# Patient Record
Sex: Female | Born: 1970 | Hispanic: Yes | Marital: Married | Smoking: Never smoker
Health system: Southern US, Community
[De-identification: ages and names within clinical notes are randomized; demographics above are authoritative.]

## PROBLEM LIST (undated history)

## (undated) DIAGNOSIS — F329 Major depressive disorder, single episode, unspecified: Secondary | ICD-10-CM

## (undated) DIAGNOSIS — F32A Depression, unspecified: Secondary | ICD-10-CM

## (undated) DIAGNOSIS — Z789 Other specified health status: Secondary | ICD-10-CM

## (undated) DIAGNOSIS — D649 Anemia, unspecified: Secondary | ICD-10-CM

## (undated) HISTORY — DX: Anemia, unspecified: D64.9

## (undated) HISTORY — DX: Major depressive disorder, single episode, unspecified: F32.9

## (undated) HISTORY — DX: Depression, unspecified: F32.A

---

## 2006-06-04 ENCOUNTER — Encounter (INDEPENDENT_AMBULATORY_CARE_PROVIDER_SITE_OTHER): Payer: Self-pay | Admitting: *Deleted

## 2006-06-04 ENCOUNTER — Inpatient Hospital Stay (HOSPITAL_COMMUNITY): Admission: RE | Admit: 2006-06-04 | Discharge: 2006-06-06 | Payer: Self-pay | Admitting: Obstetrics and Gynecology

## 2007-08-05 ENCOUNTER — Ambulatory Visit (HOSPITAL_COMMUNITY): Admission: RE | Admit: 2007-08-05 | Discharge: 2007-08-05 | Payer: Self-pay | Admitting: Family Medicine

## 2008-04-27 HISTORY — PX: CATARACT EXTRACTION, BILATERAL: SHX1313

## 2009-04-11 ENCOUNTER — Emergency Department (HOSPITAL_COMMUNITY): Admission: EM | Admit: 2009-04-11 | Discharge: 2009-04-11 | Payer: Self-pay | Admitting: Emergency Medicine

## 2010-04-27 NOTE — L&D Delivery Note (Signed)
Delivery Note At  a viable female was delivered via SVD  (Presentation Direct OA ).  APGAR: 9,9 ; weight 8# 13 oz .   Placenta status: intact, with 3 vessel Cord:  with no complications: .   Anesthesia:  None Episiotomy: None Lacerations: None Suture Repair: N/A Est. Blood Loss (mL): 500  Mom to postpartum.  Baby to nursery-stable.  Sarah Foley 03/17/2011, 6:40 PM

## 2010-05-18 ENCOUNTER — Encounter: Payer: Self-pay | Admitting: Family Medicine

## 2010-06-24 ENCOUNTER — Other Ambulatory Visit: Payer: Self-pay | Admitting: Obstetrics and Gynecology

## 2010-06-24 ENCOUNTER — Encounter (HOSPITAL_COMMUNITY): Payer: PRIVATE HEALTH INSURANCE | Attending: Obstetrics and Gynecology

## 2010-06-24 DIAGNOSIS — Z01812 Encounter for preprocedural laboratory examination: Secondary | ICD-10-CM | POA: Insufficient documentation

## 2010-06-24 LAB — CBC
MCH: 27.3 pg (ref 26.0–34.0)
MCV: 83.7 fL (ref 78.0–100.0)
Platelets: 308 10*3/uL (ref 150–400)
RDW: 14.1 % (ref 11.5–15.5)
WBC: 8.2 10*3/uL (ref 4.0–10.5)

## 2010-06-26 HISTORY — PX: TUBAL LIGATION: SHX77

## 2010-07-01 ENCOUNTER — Ambulatory Visit (HOSPITAL_COMMUNITY)
Admission: RE | Admit: 2010-07-01 | Discharge: 2010-07-01 | Disposition: A | Discharge status: Home / Self Care | Payer: PRIVATE HEALTH INSURANCE | Source: Ambulatory Visit | Attending: Obstetrics and Gynecology | Admitting: Obstetrics and Gynecology

## 2010-07-01 DIAGNOSIS — Z302 Encounter for sterilization: Secondary | ICD-10-CM | POA: Insufficient documentation

## 2010-07-04 NOTE — H&P (Signed)
  Sarah Foley, Sarah Foley NO.:  [id number]  MEDICAL RECORD NO.:  [id number]           PATIENT TYPE:  LOCATION:                                 FACILITY:  PHYSICIAN:  Tilda Burrow, M.D. DATE OF BIRTH:  04-Apr-1971  DATE OF ADMISSION: DATE OF DISCHARGE:  LH                             HISTORY & PHYSICAL   ADMISSION DIAGNOSIS:  Desire for elective permanent sterilization.  This is scheduled for at [facility] on July 01, 2010, at 7:30 a.m.  HISTORY OF PRESENT ILLNESS:  This 40 year old multiparous Hispanic female, gravida 4, para 4-0-0-4, referred from [facility] to [facility], is scheduled for county funded tubal sterilization which has been scheduled on June 18, 2010.  The patient has signed tubal sterilization forms for state family planning funds.  Signature was June 08, 2010.  Permanency of the procedure has been reviewed with the patient in Spanish by the Health Department as well as by myself.  The patient understands the permanency and articulates that she desires permanent sterilization.  She was accompanied to her visit by her husband.  PAST MEDICAL HISTORY:  Benign.  PAST SURGICAL HISTORY:  Negative.  No allergies.  No medications.  Current contraceptive method is condoms.  Her last period was February 14th.  The patient understands the importance of reliable contraception use for now.  REVIEW OF SYSTEMS:  Essentially negative.  PHYSICAL EXAMINATION:  GENERAL:  A healthy-appearing, Hispanic female, alert, oriented x3. HEENT:  Pupils equal, round, and reactive.  Extraocular movements intact. NECK:  Supple. CHEST:  Clear to auscultation. ABDOMEN:  Nontender. GENITOURINARY:  External Genitalia:  Multiparous.  Cervix nonpurulent. GC and Chlamydia performed at the Health Department and reported as negative. EXTREMITIES:  Without cyanosis, clubbing, or edema.  PLAN:  Laparoscopic tubal  sterilization, Falope rings, to be performed on July 01, 2010.     Tilda Burrow, M.D.     JVF/MEDQ  D:  06/18/2010  T:  06/19/2010  Job:  [id number]  cc:   [facility]  [facility]  Electronically Signed by Christin Bach M.D. on 07/04/2010 10:41:55 AM

## 2010-07-04 NOTE — Op Note (Signed)
  NAMEEASTYN, SKALLA NO.:  [id number]  MEDICAL RECORD NO.:  [id number]           PATIENT TYPE:  O  LOCATION:  DAYP                          FACILITY:  [facility]  PHYSICIAN:  Tilda Burrow, M.D. DATE OF BIRTH:  [birth year]  DATE OF PROCEDURE:  07/01/2010 DATE OF DISCHARGE:                              OPERATIVE REPORT   PREOPERATIVE DIAGNOSIS:  Desire for elective permanent sterilization.  POSTOPERATIVE DIAGNOSIS:  Desire for elective permanent sterilization.  PROCEDURE:  Laparoscopic tubal sterilization, Falope rings, county funded.  SURGEON:  Tilda Burrow, MD  ASSISTANT:  Amie Critchley.  ANESTHESIA:  General.  COMPLICATIONS:  None.  FINDINGS:  Normal tubes bilaterally, anteflexed uterus, normal size.  INDICATIONS:  This 40 year old female signed tubal sterilization forms at health department and referred to our office for procedure. Counseled at the health department and again in our office and procedure is scheduled on this date.  DETAILS OF PROCEDURE:  The patient was taken to operating room, prepped and draped for a combined abdominal and vaginal procedure.  The bladder was not catheterized.  Time-out was conducted, and the procedure confirmed by involved parties.  A Hulka uterine manipulator was placed on the cervix.  An abdominal procedure initiated with an infraumbilical vertical skin incision with transverse suprapubic incision.  Midline insertion of the umbilical trocar using direct insertion technique was performed with good results.  Suprapubic trocar was placed under direct visualization with laparoscopic camera activated.  Attention was directed to the pelvis where each fallopian tube was identified to its fimbriated end.  The midportion of the tube incarcerated in the Falope ring applier and the ring applied without difficulty.  Marcaine was injected percutaneously beneath the knuckle of tube and in the mesosalpinx beneath the ring to  assist with postop analgesia.  The patient had deflation of abdomen, removal of laparoscopic instruments, subcu closure of the umbilical and suprapubic trocar sites and went to recovery room in good condition with sponge and needle counts correct.     Tilda Burrow, M.D.     JVF/MEDQ  D:  07/01/2010  T:  07/02/2010  Job:  [id number]  cc:   [facility]  Electronically Signed by Christin Bach M.D. on 07/04/2010 10:42:04 AM

## 2010-08-22 LAB — GC/CHLAMYDIA PROBE AMP, GENITAL
Chlamydia: NEGATIVE
Gonorrhea: NEGATIVE

## 2010-08-22 LAB — ABO/RH

## 2010-08-22 LAB — RUBELLA ANTIBODY, IGM: Rubella: IMMUNE

## 2010-09-12 NOTE — Group Therapy Note (Signed)
Sarah Foley, Sarah Foley NO.:  [id number]   MEDICAL RECORD NO.:  [id number]          PATIENT TYPE:  INP   LOCATION:  LDR2                          FACILITY:  [facility]   PHYSICIAN:  Lazaro Arms, M.D.   DATE OF BIRTH:  08/18/1970   DATE OF PROCEDURE:  DATE OF DISCHARGE:                                 PROGRESS NOTE   DELIVERY NOTE:  [name] progressed very nicely through labor and was  noted to be fully dilated with an urge to push at approximately 10:45.  We have not seen any fluid, amniotic fluid up to this point.  She pushed  over three contractions and had a spontaneous vaginal delivery of a  viable female infant at 10:54.  The mouth and nose were suctioned on the  perineum with a bulb syringe and after delivery of the body a large  amount of very thick meconium followed.  The baby had not made any  respiratory effort at this time, so the baby was deep suctioned with a  DeLee suction, yielding about 5 mL of thick meconium-stained fluid.  At  that point, the cord which was rather short was doubly clamped and cut  and the baby was taken to the warmer for resuscitative efforts.  Apgar's  are 4 and 8.  Weight is 7 pounds 3 ounces.  Pitocin 20 units diluted in  1000 mL of lactated Ringer's was infused rapidly IV.  The placenta  separated spontaneously and was delivered via controlled cord traction  at 11 a.m..  It was inspected and appears to be intact with a three-  vessel cord.  It is being sent to pathology due to meconium staining and  low Apgar's.  The vagina was inspected and no lacerations were found.  Estimated blood loss 100 mL.      Jacklyn Shell, C.N.M.      Lazaro Arms, M.D.  Electronically Signed    FC/MEDQ  D:  06/04/2006  T:  06/04/2006  Job:  [id number]   cc:   Donna Bernard, M.D.  Fax: [phone]   [facility]

## 2010-09-12 NOTE — H&P (Signed)
Sarah Foley, Sarah Foley            ACCOUNT NO.:  [id number]   MEDICAL RECORD NO.:  [id number]          PATIENT TYPE:  INP   LOCATION:  LDR2                          FACILITY:  [facility]   PHYSICIAN:  Lazaro Arms, M.D.   DATE OF BIRTH:  08/18/1970   DATE OF ADMISSION:  06/04/2006  DATE OF DISCHARGE:  LH                              HISTORY & PHYSICAL   CHIEF COMPLAINT:  Labor pain starting at 0600.   HISTORY OF PRESENT ILLNESS:  The patient is a gravida 4, para 3 with an  EDC of June 02, 2006, based on last menstrual period and correlating  first and second trimester ultrasound.  She began prenatal care in her  first trimester, and has had regular visits since then.   PRENATAL COURSE:  Her prenatal course has been uneventful.   PRENATAL LABS:  Blood type 0 positive.  Rubella immune.  HBSAG, HIV,  RPR, gonorrhea, chlamydia are all negative.  She is sera positive for  type 2 HSV.  She is group B strep negative.  Blood pressures have been  100s-120s/60s-80s, total weight gain about 15 pounds with appropriate  fundal height growth.   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   PAST SURGICAL HISTORY:  None.   ALLERGIES:  NO DRUG ALLERGIES.   MEDICATIONS:  Valtrex 1 gram p.o. daily.   SOCIAL HISTORY:  Married.  Denies cigarette smoking, alcohol or drug  use.   OBSTETRICAL HISTORY:  She has had 3 babies in [place] vaginally and  uncomplicated.   PHYSICAL EXAMINATION:  HEENT:  Within normal limits.  HEART:  Regular rate and rhythm.  LUNGS:  Clear.  ABDOMEN:  Soft and nontender.  She is having moderate contractions every  3-4 minutes.  PELVIC:  Upon admission at 6 a.m. she was 2 cm, 50% effaced, minus 2  station.  Now 3 hours later she is 4 cm,  100% effaced, minus 1 station.  FETAL MONITORING:  Fetal heart rate is reactive.   PLAN:  She has plans for IV pain medication.      Jacklyn Shell, C.N.M.      Lazaro Arms, M.D.  Electronically Signed    FC/MEDQ  D:  06/04/2006  T:  06/04/2006  Job:  [id number]

## 2010-09-19 ENCOUNTER — Other Ambulatory Visit: Payer: Self-pay | Admitting: Obstetrics and Gynecology

## 2010-09-19 ENCOUNTER — Other Ambulatory Visit (HOSPITAL_COMMUNITY)
Admission: RE | Admit: 2010-09-19 | Discharge: 2010-09-19 | Disposition: A | Discharge status: Home / Self Care | Payer: PRIVATE HEALTH INSURANCE | Source: Ambulatory Visit | Attending: Obstetrics and Gynecology | Admitting: Obstetrics and Gynecology

## 2010-09-19 DIAGNOSIS — Z01419 Encounter for gynecological examination (general) (routine) without abnormal findings: Secondary | ICD-10-CM | POA: Insufficient documentation

## 2010-09-19 DIAGNOSIS — Z113 Encounter for screening for infections with a predominantly sexual mode of transmission: Secondary | ICD-10-CM | POA: Insufficient documentation

## 2011-03-11 ENCOUNTER — Encounter (HOSPITAL_COMMUNITY): Payer: Self-pay | Admitting: *Deleted

## 2011-03-11 ENCOUNTER — Telehealth (HOSPITAL_COMMUNITY): Payer: Self-pay | Admitting: *Deleted

## 2011-03-11 NOTE — Telephone Encounter (Signed)
Preadmission screen  

## 2011-03-17 ENCOUNTER — Inpatient Hospital Stay (HOSPITAL_COMMUNITY)
Admission: RE | Admit: 2011-03-17 | Discharge: 2011-03-19 | DRG: 775 | Disposition: A | Discharge status: Home / Self Care | Payer: Medicaid Other | Source: Ambulatory Visit | Attending: Obstetrics & Gynecology | Admitting: Obstetrics & Gynecology

## 2011-03-17 ENCOUNTER — Encounter (HOSPITAL_COMMUNITY): Payer: Self-pay

## 2011-03-17 DIAGNOSIS — O480 Post-term pregnancy: Secondary | ICD-10-CM

## 2011-03-17 DIAGNOSIS — O09529 Supervision of elderly multigravida, unspecified trimester: Secondary | ICD-10-CM

## 2011-03-17 DIAGNOSIS — Z3480 Encounter for supervision of other normal pregnancy, unspecified trimester: Secondary | ICD-10-CM

## 2011-03-17 HISTORY — DX: Other specified health status: Z78.9

## 2011-03-17 LAB — RPR: RPR Ser Ql: NONREACTIVE

## 2011-03-17 LAB — CBC
HCT: 39.3 % (ref 36.0–46.0)
Hemoglobin: 12.9 g/dL (ref 12.0–15.0)
MCH: 28.4 pg (ref 26.0–34.0)
MCHC: 32.8 g/dL (ref 30.0–36.0)
MCV: 86.4 fL (ref 78.0–100.0)

## 2011-03-17 MED ORDER — MISOPROSTOL 25 MCG QUARTER TABLET
25.0000 ug | ORAL_TABLET | ORAL | Status: DC | PRN
  Administered 2011-03-17: 25 ug via VAGINAL
  Filled 2011-03-17 (×2): qty 0.25

## 2011-03-17 MED ORDER — MEASLES, MUMPS & RUBELLA VAC ~~LOC~~ INJ
0.5000 mL | INJECTION | Freq: Once | SUBCUTANEOUS | Status: DC
  Filled 2011-03-17: qty 0.5

## 2011-03-17 MED ORDER — LACTATED RINGERS IV SOLN: 500.0000 mL | INTRAVENOUS | Status: DC | PRN

## 2011-03-17 MED ORDER — TETANUS-DIPHTH-ACELL PERTUSSIS 5-2.5-18.5 LF-MCG/0.5 IM SUSP
0.5000 mL | Freq: Once | INTRAMUSCULAR | Status: AC
  Administered 2011-03-19: 0.5 mL via INTRAMUSCULAR
  Filled 2011-03-17: qty 0.5

## 2011-03-17 MED ORDER — ACETAMINOPHEN 325 MG PO TABS: 650.0000 mg | ORAL_TABLET | ORAL | Status: DC | PRN

## 2011-03-17 MED ORDER — DIPHENHYDRAMINE HCL 25 MG PO CAPS: 25.0000 mg | ORAL_CAPSULE | Freq: Four times a day (QID) | ORAL | Status: DC | PRN

## 2011-03-17 MED ORDER — SENNOSIDES-DOCUSATE SODIUM 8.6-50 MG PO TABS
2.0000 | ORAL_TABLET | Freq: Every day | ORAL | Status: DC
  Administered 2011-03-17 – 2011-03-18 (×2): 2 via ORAL

## 2011-03-17 MED ORDER — LIDOCAINE HCL (PF) 1 % IJ SOLN
30.0000 mL | INTRAMUSCULAR | Status: DC | PRN
  Filled 2011-03-17: qty 30

## 2011-03-17 MED ORDER — OXYTOCIN 20 UNITS IN LACTATED RINGERS INFUSION - SIMPLE
1.0000 m[IU]/min | INTRAVENOUS | Status: DC
  Administered 2011-03-17: 2 m[IU]/min via INTRAVENOUS
  Filled 2011-03-17: qty 1000

## 2011-03-17 MED ORDER — OXYCODONE-ACETAMINOPHEN 5-325 MG PO TABS
1.0000 | ORAL_TABLET | ORAL | Status: DC | PRN
  Administered 2011-03-17: 1 via ORAL

## 2011-03-17 MED ORDER — FLEET ENEMA 7-19 GM/118ML RE ENEM: 1.0000 | ENEMA | RECTAL | Status: DC | PRN

## 2011-03-17 MED ORDER — DIBUCAINE 1 % RE OINT: 1.0000 "application " | TOPICAL_OINTMENT | RECTAL | Status: DC | PRN

## 2011-03-17 MED ORDER — LACTATED RINGERS IV SOLN
INTRAVENOUS | Status: DC
  Administered 2011-03-17: 08:00:00 via INTRAVENOUS

## 2011-03-17 MED ORDER — IBUPROFEN 600 MG PO TABS
600.0000 mg | ORAL_TABLET | Freq: Four times a day (QID) | ORAL | Status: DC
  Administered 2011-03-18 – 2011-03-19 (×5): 600 mg via ORAL
  Filled 2011-03-17 (×6): qty 1

## 2011-03-17 MED ORDER — ONDANSETRON HCL 4 MG PO TABS: 4.0000 mg | ORAL_TABLET | ORAL | Status: DC | PRN

## 2011-03-17 MED ORDER — PRENATAL PLUS 27-1 MG PO TABS
1.0000 | ORAL_TABLET | Freq: Every day | ORAL | Status: DC
  Administered 2011-03-18 – 2011-03-19 (×2): 1 via ORAL
  Filled 2011-03-17 (×2): qty 1

## 2011-03-17 MED ORDER — TERBUTALINE SULFATE 1 MG/ML IJ SOLN: 0.2500 mg | Freq: Once | INTRAMUSCULAR | Status: DC | PRN

## 2011-03-17 MED ORDER — SIMETHICONE 80 MG PO CHEW: 80.0000 mg | CHEWABLE_TABLET | ORAL | Status: DC | PRN

## 2011-03-17 MED ORDER — ZOLPIDEM TARTRATE 5 MG PO TABS: 5.0000 mg | ORAL_TABLET | Freq: Every evening | ORAL | Status: DC | PRN

## 2011-03-17 MED ORDER — CITRIC ACID-SODIUM CITRATE 334-500 MG/5ML PO SOLN: 30.0000 mL | ORAL | Status: DC | PRN

## 2011-03-17 MED ORDER — OXYCODONE-ACETAMINOPHEN 5-325 MG PO TABS
2.0000 | ORAL_TABLET | ORAL | Status: DC | PRN
  Filled 2011-03-17: qty 1

## 2011-03-17 MED ORDER — OXYTOCIN 20 UNITS IN LACTATED RINGERS INFUSION - SIMPLE
125.0000 mL/h | Freq: Once | INTRAVENOUS | Status: AC
  Administered 2011-03-17: 999 mL/h via INTRAVENOUS

## 2011-03-17 MED ORDER — ONDANSETRON HCL 4 MG/2ML IJ SOLN: 4.0000 mg | Freq: Four times a day (QID) | INTRAMUSCULAR | Status: DC | PRN

## 2011-03-17 MED ORDER — IBUPROFEN 600 MG PO TABS
600.0000 mg | ORAL_TABLET | Freq: Four times a day (QID) | ORAL | Status: DC | PRN
  Administered 2011-03-17: 600 mg via ORAL
  Filled 2011-03-17: qty 1

## 2011-03-17 MED ORDER — LANOLIN HYDROUS EX OINT: TOPICAL_OINTMENT | CUTANEOUS | Status: DC | PRN

## 2011-03-17 MED ORDER — WITCH HAZEL-GLYCERIN EX PADS: 1.0000 "application " | MEDICATED_PAD | CUTANEOUS | Status: DC | PRN

## 2011-03-17 MED ORDER — ONDANSETRON HCL 4 MG/2ML IJ SOLN: 4.0000 mg | INTRAMUSCULAR | Status: DC | PRN

## 2011-03-17 MED ORDER — BENZOCAINE-MENTHOL 20-0.5 % EX AERO: 1.0000 "application " | INHALATION_SPRAY | CUTANEOUS | Status: DC | PRN

## 2011-03-17 MED ORDER — OXYTOCIN BOLUS FROM INFUSION
500.0000 mL | Freq: Once | INTRAVENOUS | Status: DC
  Filled 2011-03-17: qty 500

## 2011-03-17 NOTE — H&P (Signed)
Sarah Foley is a 40 y.o. female presenting for induction of labor for postdates. Maternal Medical History:  Prenatal Complications - Diabetes: none.    This is a 40 year old G5P4004 at 41 weeks and 0 days who is being admitted for labor induction for postdates.  Denies fevers, chills, nausea, vomiting, LOF, decreased FA, vaginal bleeding, vaginal discharge, LOF, abdominal pain, back pain, changes in vision, HA.   OB History    Grav Para Term Preterm Abortions TAB SAB Ect Mult Living   5 4 4       4      Past Medical History  Diagnosis Date  . Cataract     Leye  . No pertinent past medical history    Past Surgical History  Procedure Date  . Tubal ligation 06/2010    neg HCG prior to tubal +hcg 1 week after  . Cataract extraction, bilateral    Family History: family history includes Mental retardation in her sister.  There is no history of Anesthesia problems, and Hypotension, and Malignant hyperthermia, and Pseudochol deficiency, . Social History:  reports that she has never smoked. She has never used smokeless tobacco. She reports that she does not drink alcohol or use illicit drugs.  Review of Systems  Eyes: Negative for blurred vision.  All other systems reviewed and are negative.    Dilation: Closed Effacement (%): 50 Station: Ballotable Exam by:: Stinson, DO Blood pressure 114/56, pulse 71, temperature 97.7 F (36.5 C), temperature source Oral, resp. rate 20, height 5\' 1"  (1.549 m), weight 101.606 kg (224 lb). Maternal Exam:  Abdomen: Fundal height is term.   Fetal presentation: vertex  Introitus: Normal vulva. Normal vagina.    Physical Exam  Constitutional: She is oriented to person, place, and time. She appears well-developed and well-nourished.  HENT:  Head: Normocephalic and atraumatic.  GI: Soft. Bowel sounds are normal. She exhibits no distension and no mass. There is no tenderness. There is no rebound and no guarding.  Neurological: She is  alert and oriented to person, place, and time.  Skin: Skin is warm and dry. No rash noted. No erythema. No pallor.    Dilation: Closed Effacement (%): 50 Cervical Position: Posterior Station: Ballotable Presentation: Vertex Exam by:: Stinson, DO  Prenatal labs: ABO, Rh: O/Positive/-- (04/27 0000) Antibody: Negative (04/27 0000) Rubella: Immune (04/27 0000) RPR: Nonreactive (04/27 0000)  HBsAg: Negative (04/27 0000)  HIV: Non-reactive (04/27 0000)  GBS: Negative (10/24 0000)  2hr GTT: 78, 136, 100  Assessment/Plan: #29 40 year old G5P4004 at 41weeks and 0 days. #2 Advanced Maternal Age #3 GBS neg #4 Postdates  Admit to L&D Cytotec 25 mcg vaginally q 4hours Able to eat until start pitocin.    STINSON, JACOB JEHIEL 03/17/2011, 9:56 AM

## 2011-03-17 NOTE — Progress Notes (Signed)
Subjective: Very little contractions.  Objective: BP 113/63  Pulse 73  Temp(Src) 97.7 F (36.5 C) (Oral)  Resp 20  Ht 5\' 1"  (1.549 m)  Wt 101.606 kg (224 lb)  BMI 42.32 kg/m2     FHT:  FHR: 150s bpm, variability: moderate,  accelerations:  Present,  decelerations:  Absent UC:   none SVE:   Dilation: 3 Effacement (%): 70 Station: -2 Exam by:: Dr Adrian Blackwater  Labs: Lab Results  Component Value Date   WBC 9.3 03/17/2011   HGB 12.9 03/17/2011   HCT 39.3 03/17/2011   MCV 86.4 03/17/2011   PLT 220 03/17/2011    Assessment / Plan: IOL.  Will start pitocin. Fetal Wellbeing:  Category I Pain Control:  Labor support without medications I/D:  n/a Anticipated MOD:  NSVD  Atilla Zollner JEHIEL 03/17/2011, 2:13 PM

## 2011-03-18 MED ORDER — INFLUENZA VIRUS VACC SPLIT PF IM SUSP
0.5000 mL | INTRAMUSCULAR | Status: AC
  Administered 2011-03-19: 0.5 mL via INTRAMUSCULAR
  Filled 2011-03-18: qty 0.5

## 2011-03-18 MED ORDER — MENTHOL 3 MG MT LOZG
1.0000 | LOZENGE | OROMUCOSAL | Status: DC | PRN
  Administered 2011-03-18: 3 mg via ORAL
  Filled 2011-03-18: qty 9

## 2011-03-18 MED ORDER — BENZOCAINE-MENTHOL 20-0.5 % EX AERO
INHALATION_SPRAY | CUTANEOUS | Status: AC
  Administered 2011-03-18: 15:00:00
  Filled 2011-03-18: qty 56

## 2011-03-18 NOTE — Progress Notes (Signed)
UR chart review completed.  

## 2011-03-18 NOTE — Progress Notes (Signed)
Post Partum Day 1 Subjective: no complaints, up ad lib, voiding and tolerating PO  Objective: Blood pressure 107/67, pulse 69, temperature 98.3 F (36.8 C), temperature source Oral, resp. rate 18, height 5\' 1"  (1.549 m), weight 101.606 kg (224 lb), unknown if currently breastfeeding.  Physical Exam:  General: alert, cooperative and fatigued Lochia: appropriate Uterine Fundus: firm   Basename 03/17/11 0915  HGB 12.9  HCT 39.3    Assessment/Plan: Plan for discharge tomorrow and Breastfeeding   LOS: 1 day   Josedejesus Marcum 03/18/2011, 7:31 AM

## 2011-03-19 DIAGNOSIS — Z3480 Encounter for supervision of other normal pregnancy, unspecified trimester: Secondary | ICD-10-CM

## 2011-03-19 MED ORDER — IBUPROFEN 600 MG PO TABS: 600.0000 mg | ORAL_TABLET | Freq: Four times a day (QID) | ORAL | Status: AC

## 2011-03-19 NOTE — Progress Notes (Signed)
Subjective  Rt upper leg has felt heavy and numb, but not painful, since delivery. No difficulty walking. Breastfeeding without difficulty. Lochia tapering. Afterpains mild.  Ambulatory without orthostatic sx.  Had BTL in early pregnancy.  Bonding well. Has help at home. Desires discharge.   Objective  Filed Vitals:   03/18/11 2150  BP: 95/55  Pulse: 80  Temp: 98.4 F (36.9 C)  Resp: 18    Breasts: soft, filling  ZOX:WRUE, nontender, fundus firm, well below umbilicus  Ext: No redness, tenderness,warmth or swelling of rt upper extremity; nontender calves; trace dependent edema    Assessment   G1P1001 PPD#1 s/p uncomplicated NSVD, doing well  Plan  Discharge home.  Meds: PN vitamins Discharge instructions: per booklet, reviewed F/U at [facility] in 6 weeks   Kolden Dupee 03/19/2011 6:37 AM

## 2011-03-19 NOTE — Discharge Summary (Signed)
Obstetric Discharge Summary Reason for Admission: onset of labor Prenatal Procedures: ultrasound Had BTL early in pregnancy Intrapartum Procedures: spontaneous vaginal delivery Postpartum Procedures: none Complications-Operative and Postpartum: none Hemoglobin  Date Value Range Status  03/17/2011 12.9  12.0-15.0 (g/dL) Final     HCT  Date Value Range Status  03/17/2011 39.3  36.0-46.0 (%) Final    Discharge Diagnoses: Term Pregnancy-delivered  Discharge Information: Date: 03/19/2011 Activity: pelvic rest Diet: routine Medications: Ibuprofen Condition: stable Instructions: refer to practice specific booklet Discharge to: home Follow-up Information    Follow up with [facility]. Make an appointment in 6 weeks.         Newborn Data: Live born female  Birth Weight: 8 lb 13.3 oz (4006 g) APGAR: 9, 9  Home with mother.  Bronc Brosseau 03/19/2011, 7:01 AM

## 2011-03-23 NOTE — Discharge Summary (Signed)
Agree with above discharge document.

## 2011-04-27 ENCOUNTER — Inpatient Hospital Stay (HOSPITAL_COMMUNITY): Admission: AD | Admit: 2011-04-27 | Payer: Self-pay | Source: Ambulatory Visit | Admitting: Obstetrics and Gynecology

## 2011-10-21 ENCOUNTER — Other Ambulatory Visit (HOSPITAL_COMMUNITY): Payer: Self-pay | Admitting: Nurse Practitioner

## 2011-10-21 ENCOUNTER — Other Ambulatory Visit (HOSPITAL_COMMUNITY): Payer: Self-pay | Admitting: *Deleted

## 2011-10-28 ENCOUNTER — Ambulatory Visit (HOSPITAL_COMMUNITY)
Admission: RE | Admit: 2011-10-28 | Discharge: 2011-10-28 | Disposition: A | Discharge status: Home / Self Care | Payer: PRIVATE HEALTH INSURANCE | Source: Ambulatory Visit | Attending: *Deleted | Admitting: *Deleted

## 2011-10-28 ENCOUNTER — Other Ambulatory Visit (HOSPITAL_COMMUNITY): Payer: Self-pay | Admitting: *Deleted

## 2011-10-28 ENCOUNTER — Encounter (HOSPITAL_COMMUNITY): Payer: Medicaid Other

## 2011-10-28 DIAGNOSIS — N63 Unspecified lump in unspecified breast: Secondary | ICD-10-CM | POA: Insufficient documentation

## 2012-10-31 ENCOUNTER — Other Ambulatory Visit (HOSPITAL_COMMUNITY): Payer: Self-pay | Admitting: Physician Assistant

## 2012-10-31 DIAGNOSIS — Z139 Encounter for screening, unspecified: Secondary | ICD-10-CM

## 2012-11-03 ENCOUNTER — Ambulatory Visit (HOSPITAL_COMMUNITY)
Admission: RE | Admit: 2012-11-03 | Discharge: 2012-11-03 | Disposition: A | Discharge status: Home / Self Care | Payer: Self-pay | Source: Ambulatory Visit | Attending: Physician Assistant | Admitting: Physician Assistant

## 2012-11-03 DIAGNOSIS — Z139 Encounter for screening, unspecified: Secondary | ICD-10-CM

## 2013-11-29 ENCOUNTER — Other Ambulatory Visit (HOSPITAL_COMMUNITY): Payer: Self-pay | Admitting: Physician Assistant

## 2013-11-29 DIAGNOSIS — Z1231 Encounter for screening mammogram for malignant neoplasm of breast: Secondary | ICD-10-CM

## 2013-12-11 ENCOUNTER — Ambulatory Visit (HOSPITAL_COMMUNITY)
Admission: RE | Admit: 2013-12-11 | Discharge: 2013-12-11 | Disposition: A | Discharge status: Home / Self Care | Payer: Medicaid Other | Source: Ambulatory Visit | Attending: Physician Assistant | Admitting: Physician Assistant

## 2013-12-11 DIAGNOSIS — Z1231 Encounter for screening mammogram for malignant neoplasm of breast: Secondary | ICD-10-CM

## 2013-12-14 ENCOUNTER — Ambulatory Visit (HOSPITAL_COMMUNITY): Payer: Medicaid Other

## 2014-02-26 ENCOUNTER — Encounter (HOSPITAL_COMMUNITY): Payer: Self-pay

## 2014-04-17 ENCOUNTER — Encounter: Payer: Self-pay | Admitting: Women's Health

## 2014-04-17 ENCOUNTER — Other Ambulatory Visit: Payer: Self-pay | Admitting: Women's Health

## 2014-05-31 ENCOUNTER — Other Ambulatory Visit (HOSPITAL_COMMUNITY): Payer: Self-pay | Admitting: Physician Assistant

## 2014-05-31 DIAGNOSIS — N946 Dysmenorrhea, unspecified: Secondary | ICD-10-CM

## 2014-06-07 ENCOUNTER — Ambulatory Visit (HOSPITAL_COMMUNITY)
Admission: RE | Admit: 2014-06-07 | Discharge: 2014-06-07 | Disposition: A | Discharge status: Home / Self Care | Payer: Self-pay | Source: Ambulatory Visit | Attending: Physician Assistant | Admitting: Physician Assistant

## 2014-06-07 ENCOUNTER — Ambulatory Visit (HOSPITAL_COMMUNITY): Payer: Self-pay

## 2014-06-07 DIAGNOSIS — N946 Dysmenorrhea, unspecified: Secondary | ICD-10-CM | POA: Insufficient documentation

## 2014-06-28 ENCOUNTER — Encounter: Payer: Self-pay | Admitting: Obstetrics and Gynecology

## 2014-06-28 ENCOUNTER — Ambulatory Visit (INDEPENDENT_AMBULATORY_CARE_PROVIDER_SITE_OTHER): Payer: Self-pay | Admitting: Obstetrics and Gynecology

## 2014-06-28 VITALS — BP 110/60 | HR 70 | Wt 213.0 lb

## 2014-06-28 DIAGNOSIS — D509 Iron deficiency anemia, unspecified: Secondary | ICD-10-CM

## 2014-06-28 DIAGNOSIS — N920 Excessive and frequent menstruation with regular cycle: Secondary | ICD-10-CM

## 2014-06-28 NOTE — Progress Notes (Signed)
Patient ID: Sarah Foley, female   DOB: 14-Jul-1970, 44 y.o.   MRN: [id number] Pt here today for heavy periods.    [facility] Visit  Patient name: Sarah Foley MRN [id number]  Date of birth: 13-Aug-1970  CC & HPI:  Sarah Foley is a 44 y.o. female presenting today for consult re control of menses  ROS:  Has had u/s. Seen at [facility] , see scanned records, reviewed  Pertinent History Reviewed:   Reviewed: Significant for btl  In past.  Medical         Past Medical History  Diagnosis Date  . Cataract     Leye  . No pertinent past medical history                               Surgical Hx:    Past Surgical History  Procedure Laterality Date  . Cataract extraction, bilateral  2010  . Tubal ligation  06/2010    neg HCG prior to tubal +hcg 1 week after   Medications: Reviewed & Updated - see associated section                       Current outpatient prescriptions:  .  prenatal vitamin w/FE, FA (PRENATAL 1 + 1) 27-1 MG TABS, Take 1 tablet by mouth daily.  , Disp: , Rfl:    Social History: Reviewed -  reports that she has never smoked. She has never used smokeless tobacco.   Objective Findings:  Vitals: Blood pressure 110/60, pulse 70, weight 213 lb (96.616 kg), last menstrual period 06/28/2014.  Physical Examination: General appearance - alert, well appearing, and in no distress, oriented to person, place, and time and overweight Mental status - alert, oriented to person, place, and time, normal mood, behavior, speech, dress, motor activity, and thought processes, affect appropriate to mood U/s : [facility] see records.  Assessment & Plan:   A:  1. Menorrhagia with normal anatomy 2. Mild anemia  P: pt is not eligible for county IUD placement as BTL has been done 1. Pt is given info to apply for IUD MIRENA and Liletta, if she can obtain IUD herself, we can insert. 2 pt to apply x 2 and let us know of results. F/u 1 month or prn Time: over  45 minutes spent in phone calls, coordination of care, investigation and counselling of pt , and records review 3. Consider depo provera or OCP if not eligible for IUD>

## 2014-07-02 ENCOUNTER — Encounter: Payer: Self-pay | Admitting: Obstetrics and Gynecology

## 2014-08-10 ENCOUNTER — Ambulatory Visit (INDEPENDENT_AMBULATORY_CARE_PROVIDER_SITE_OTHER): Payer: Self-pay | Admitting: Obstetrics and Gynecology

## 2014-08-10 ENCOUNTER — Encounter: Payer: Self-pay | Admitting: Obstetrics and Gynecology

## 2014-08-10 VITALS — BP 100/60 | Ht 61.0 in | Wt 211.0 lb

## 2014-08-10 DIAGNOSIS — N921 Excessive and frequent menstruation with irregular cycle: Secondary | ICD-10-CM

## 2014-08-10 NOTE — Progress Notes (Signed)
Patient ID: Sarah Foley, female   DOB: Sep 06, 1970, 44 y.o.   MRN: [id number]  This chart was SCRIBED for Mallory Shirk, MD by Stephania Fragmin, ED Scribe. This patient was seen in my office and the patient's care was started at 10:19 AM.    [facility] Visit  Patient name: Sarah Foley MRN [id number]  Date of birth: 10-Sep-1970  CC & HPI:  Sarah Foley is a 44 y.o. female presenting today for follow-up of menorrhagia. Patient  Is asking for assistance for applying for Mirena. Patient has brought blank forms [facility] Patient Assistance Program Application Forms) with her to fill out.   ROS:  n/a  Pertinent History Reviewed:   Reviewed: Significant for BTL Medical         Past Medical History  Diagnosis Date  . Cataract     Leye  . No pertinent past medical history   . Depression   . Anemia                               Surgical Hx:    Past Surgical History  Procedure Laterality Date  . Cataract extraction, bilateral  2010  . Tubal ligation  06/2010    neg HCG prior to tubal +hcg 1 week after   Medications: Reviewed & Updated - see associated section                       Current outpatient prescriptions:  .  ferrous sulfate 325 (65 FE) MG tablet, Take 325 mg by mouth daily with breakfast., Disp: , Rfl:    Social History: Reviewed -  reports that she has never smoked. She has never used smokeless tobacco.  Objective Findings:  Vitals: Blood pressure 100/60, height 5\' 1"  (1.549 m), weight 211 lb (95.709 kg), last menstrual period 07/20/2014.  Physical Examination: Patient here for discussion only.  Spent about 56minutes in office filling out patient's forms and answering any questions patient has.  Assessment & Plan:   Follow up in 1 month. Or upon receipt of Mirena approval  I personally performed the services described in this documentation, which was SCRIBED in my presence. The recorded information has been reviewed and considered  accurate. It has been edited as necessary during review. [name], MD

## 2014-08-10 NOTE — Progress Notes (Signed)
Patient ID: Sarah Foley, female   DOB: 1970-09-17, 44 y.o.   MRN: [id number] Pt here today for follow up. Pt to discuss the next step and what to do about the bleeding.

## 2014-09-07 ENCOUNTER — Encounter: Payer: Self-pay | Admitting: Obstetrics and Gynecology

## 2014-09-07 ENCOUNTER — Ambulatory Visit (INDEPENDENT_AMBULATORY_CARE_PROVIDER_SITE_OTHER): Payer: Self-pay | Admitting: Obstetrics and Gynecology

## 2014-09-07 ENCOUNTER — Ambulatory Visit: Payer: Self-pay | Admitting: Obstetrics and Gynecology

## 2014-09-07 VITALS — BP 110/60 | Wt 210.0 lb

## 2014-09-07 DIAGNOSIS — D509 Iron deficiency anemia, unspecified: Secondary | ICD-10-CM

## 2014-09-07 DIAGNOSIS — N920 Excessive and frequent menstruation with regular cycle: Secondary | ICD-10-CM

## 2014-09-07 NOTE — Progress Notes (Signed)
Patient ID: Sarah Foley, female   DOB: 04-02-1971, 44 y.o.   MRN: [id number] Pt here today for follow up and possible IUD insertion.

## 2014-09-07 NOTE — Progress Notes (Signed)
Patient ID: Sarah Foley, female   DOB: February 29, 1971, 44 y.o.   MRN: [id number] Patient ID: Sarah Foley, female   DOB: [birth year], 44 y.o.   MRN: [id number]   [facility] Visit  Patient name: Sarah Foley MRN [id number]  Date of birth: 02-Jan-1971  CC & HPI:  Sarah Foley is a 44 y.o. female presenting today for follow-up treatment of menorrhagia with possible IUD insertion. [facility] Patient Assistance Program Application Forms were filled out during the last visit. She estimates that her LMP began 08/26/14.  ROS:  A complete review of systems was obtained and all systems are negative except as noted in the HPI and PMH.    Pertinent History Reviewed:   Reviewed: Significant for tubal ligation Medical         Past Medical History  Diagnosis Date  . Cataract     Leye  . No pertinent past medical history   . Depression   . Anemia                               Surgical Hx:    Past Surgical History  Procedure Laterality Date  . Cataract extraction, bilateral  2010  . Tubal ligation  06/2010    neg HCG prior to tubal +hcg 1 week after   Medications: Reviewed & Updated - see associated section                       Current outpatient prescriptions:  .  ferrous sulfate 325 (65 FE) MG tablet, Take 325 mg by mouth daily with breakfast., Disp: , Rfl:    Social History: Reviewed -  reports that she has never smoked. She has never used smokeless tobacco.  Objective Findings:  Vitals: Blood pressure 110/60, weight 210 lb (95.255 kg), last menstrual period 07/20/2014.  Physical Examination: Patient here for discussion only.    Assessment & Plan:   A:  1. Pt desires IUD insertion. She states her LMP was ~08/26/14, so we will schedule insertion for the second or third day of her following menses.  P:  1. Schedule IUD insertion for June 2-3.  This chart was SCRIBED for Mallory Shirk, MD by Stephania Fragmin, ED Scribe. This patient was seen in  room 2 and the patient's care was started at 1:52 PM.  I personally performed the services described in this documentation, which was SCRIBED in my presence. The recorded information has been reviewed and considered accurate. It has been edited as necessary during review. [name], MD

## 2014-09-07 NOTE — Progress Notes (Deleted)
Patient ID: Sarah Foley, female   DOB: Jul 23, 1970, 44 y.o.   MRN: [id number]   [facility]  Patient name: Sarah Foley MRN [id number]  Date of birth: [birth year]  CC & HPI:  Sarah Foley is a 44 y.o. female presenting today for follow-up treatment of menorrhagia with possible IUD insertion. [facility] Patient Assistance Program Application Forms were filled out during the last visit. She estimates that her LMP began 08/26/14.  ROS:  A complete review of systems was obtained and all systems are negative except as noted in the HPI and PMH.    Pertinent History Reviewed:   Reviewed: Significant for tubal ligation Medical         Past Medical History  Diagnosis Date   Cataract     Leye   No pertinent past medical history    Depression    Anemia                               Surgical Hx:    Past Surgical History  Procedure Laterality Date   Cataract extraction, bilateral  2010   Tubal ligation  06/2010    neg HCG prior to tubal +hcg 1 week after   Medications: Reviewed & Updated - see associated section                       Current outpatient prescriptions:    ferrous sulfate 325 (65 FE) MG tablet, Take 325 mg by mouth daily with breakfast., Disp: , Rfl:    Social History: Reviewed -  reports that she has never smoked. She has never used smokeless tobacco.  Objective Findings:  Vitals: Blood pressure 110/60, weight 210 lb (95.255 kg), last menstrual period 07/20/2014.  Physical Examination: Patient here for discussion only.    Assessment & Plan:   A:  1. Pt desires IUD insertion. She states her LMP was ~08/26/14, so we will schedule insertion for the second or third day of her following menses.  P:  1. Schedule IUD insertion for June 2-3.  This chart was SCRIBED for Mallory Shirk, MD by Stephania Fragmin, ED Scribe. This patient was seen in room 2 and the patient's care was started at 1:52 PM.  I personally performed the  services described in this documentation, which was SCRIBED in my presence. The recorded information has been reviewed and considered accurate. It has been edited as necessary during review. [name], MD      I

## 2014-09-27 ENCOUNTER — Ambulatory Visit (INDEPENDENT_AMBULATORY_CARE_PROVIDER_SITE_OTHER): Payer: Self-pay | Admitting: Obstetrics and Gynecology

## 2014-09-27 ENCOUNTER — Encounter: Payer: Self-pay | Admitting: Obstetrics and Gynecology

## 2014-09-27 VITALS — BP 110/58 | HR 68 | Wt 207.4 lb

## 2014-09-27 DIAGNOSIS — Z30430 Encounter for insertion of intrauterine contraceptive device: Secondary | ICD-10-CM

## 2014-09-27 DIAGNOSIS — Z3202 Encounter for pregnancy test, result negative: Secondary | ICD-10-CM

## 2014-09-27 DIAGNOSIS — N920 Excessive and frequent menstruation with regular cycle: Secondary | ICD-10-CM

## 2014-09-27 LAB — POCT URINE PREGNANCY: PREG TEST UR: NEGATIVE

## 2014-09-27 NOTE — Progress Notes (Signed)
Patient ID: Sarah Foley, female   DOB: 05/05/1970, 44 y.o.   MRN: [id number]  GYNECOLOGY CLINIC PROCEDURE NOTE  Sarah Foley is a 44 y.o. [id number] here for Mirena IUD insertion.  No GYN concerns.  Last pap smear was on 09/19/10 and was normal.  IUD Insertion Procedure Note Patient identified, informed consent performed, consent signed.   Discussed risks of irregular bleeding, cramping, infection, malpositioning or misplacement of the IUD outside the uterus which may require further procedure such as laparoscopy. Time out was performed.  Urine pregnancy test negative.  Speculum placed in the vagina.  Cervix visualized.  Cleaned with Betadine x 2.  Grasped anteriorly with a single tooth tenaculum.  Uterus sounded to 9 cm. Mirena IUD placed per manufacturer's recommendations.  Strings trimmed to 3 cm. Tenaculum was removed, good hemostasis noted.  Patient tolerated procedure well, with minimal pain, per pt.  Transvaginal Ultrasound was used to confirm IUD position  Patient was given post-procedure instructions.  She was advised to have backup contraception for one week.  Patient was also asked to check IUD strings periodically.  Follow up in 4 weeks for IUD check.   This chart was SCRIBED for Mallory Shirk, MD by Stephania Fragmin, ED Scribe. This patient was seen in room 2 and the patient's care was started at 12:00 PM.  I personally performed the services described in this documentation, which was SCRIBED in my presence. The recorded information has been reviewed and considered accurate. It has been edited as necessary during review. [name], MD

## 2014-10-25 ENCOUNTER — Encounter: Payer: Self-pay | Admitting: Obstetrics and Gynecology

## 2014-10-25 ENCOUNTER — Ambulatory Visit (INDEPENDENT_AMBULATORY_CARE_PROVIDER_SITE_OTHER): Payer: Self-pay | Admitting: Obstetrics and Gynecology

## 2014-10-25 VITALS — BP 106/58 | HR 68 | Wt 205.6 lb

## 2014-10-25 DIAGNOSIS — Z30431 Encounter for routine checking of intrauterine contraceptive device: Secondary | ICD-10-CM | POA: Insufficient documentation

## 2014-10-25 DIAGNOSIS — R102 Pelvic and perineal pain: Secondary | ICD-10-CM

## 2014-10-25 NOTE — Progress Notes (Signed)
   [facility] Visit  Patient name: Sarah Foley MRN [id number]  Date of birth: 08-30-1970  CC & HPI:  Sarah Foley is a 44 y.o. female presenting today for IUD check. She states she has been menstruating with light bleeding for 10 days. She reports pelvic pain only today.   ROS:  A complete review of systems was obtained and all systems are negative except as noted in the HPI and PMH.    Pertinent History Reviewed:   Reviewed: Significant for tubal ligation Medical         Past Medical History  Diagnosis Date  . Cataract     Leye  . No pertinent past medical history   . Depression   . Anemia                               Surgical Hx:    Past Surgical History  Procedure Laterality Date  . Cataract extraction, bilateral  2010  . Tubal ligation  06/2010    neg HCG prior to tubal +hcg 1 week after   Medications: Reviewed & Updated - see associated section                       Current outpatient prescriptions:  .  ferrous sulfate 325 (65 FE) MG tablet, Take 325 mg by mouth daily with breakfast., Disp: , Rfl:    Social History: Reviewed -  reports that she has never smoked. She has never used smokeless tobacco.  Objective Findings:  Vitals: Blood pressure 106/58, pulse 68, weight 205 lb 9.6 oz (93.26 kg).  Physical Examination: General appearance - alert, well appearing, and in no distress, oriented to person, place, and time and overweight Mental status - alert, oriented to person, place, and time, normal mood, behavior, speech, dress, motor activity, and thought processes, affect appropriate to mood Pelvic - normal external genitalia VULVA: normal appearing vulva with no masses, tenderness or lesions,  VAGINA: heavy menstrual blood CERVIX: normal appearing cervix with string length 2 cm beyond cervix ext os.  On U/S,done by JVF as part of assessment. IUD stops 1 cm from the,top of the uterus. It appears the prongs are not widely  deployed. Adnexa are normal, and nontender u/s contact with uterus.  CDS fluid normal  Assessment & Plan:   A:  1. IUD migration 1 cm from apex of endometrial cavity.   P:  1. Patient keep a period calendar in 6 weeks and return. Hopefully, her menses will lighten in that time with IUD presence.  2. See in 6 wk   This chart was SCRIBED for Sarah Shirk, MD by Sarah Foley, ED Scribe. This patient was seen in room 2, and the patient's care was started at 9:47 AM.  I personally performed the services described in this documentation, which was SCRIBED in my presence. The recorded information has been reviewed and considered accurate. It has been edited as necessary during review. Sarah Kind, MD

## 2014-11-15 ENCOUNTER — Ambulatory Visit (INDEPENDENT_AMBULATORY_CARE_PROVIDER_SITE_OTHER): Payer: Self-pay | Admitting: Obstetrics and Gynecology

## 2014-11-15 ENCOUNTER — Encounter: Payer: Self-pay | Admitting: Obstetrics and Gynecology

## 2014-11-15 VITALS — BP 100/58 | Wt 203.5 lb

## 2014-11-15 DIAGNOSIS — Z30431 Encounter for routine checking of intrauterine contraceptive device: Secondary | ICD-10-CM

## 2014-11-15 DIAGNOSIS — D509 Iron deficiency anemia, unspecified: Secondary | ICD-10-CM

## 2014-11-15 DIAGNOSIS — N920 Excessive and frequent menstruation with regular cycle: Secondary | ICD-10-CM

## 2014-11-15 MED ORDER — NORETHINDRONE 0.35 MG PO TABS: 1.0000 | ORAL_TABLET | Freq: Every day | ORAL | Status: DC

## 2014-11-15 NOTE — Progress Notes (Signed)
Patient ID: Sarah Foley, female   DOB: [birth year], 44 y.o.   MRN: [id number]   [facility] Visit  Patient name: Sarah Foley MRN [id number]  Date of birth: Jan 27, 1971  CC & HPI:  Sarah Foley is a 44 y.o. female presenting today for an IUD check and atraumatic, acute, intermittent left sided pain with intercourse and urination.  Pt cannot feel iud string ROS:  A complete 10 system review of systems was obtained and all systems are negative except as noted in the HPI and PMH.    Pertinent History Reviewed:   Reviewed: Significant for tubal ligation.  Medical         Past Medical History  Diagnosis Date  . Cataract     Leye  . No pertinent past medical history   . Depression   . Anemia                               Surgical Hx:    Past Surgical History  Procedure Laterality Date  . Cataract extraction, bilateral  2010  . Tubal ligation  06/2010    neg HCG prior to tubal +hcg 1 week after   Medications: Reviewed & Updated - see associated section                       Current outpatient prescriptions:  .  ferrous sulfate 325 (65 FE) MG tablet, Take 325 mg by mouth daily with breakfast., Disp: , Rfl:    Social History: Reviewed -  reports that she has never smoked. She has never used smokeless tobacco.  Objective Findings:  Vitals: Blood pressure 100/58, weight 203 lb 8 oz (92.307 kg).  Physical Examination: General appearance - alert, well appearing, and in no distress Mental status - alert, oriented to person, place, and time Pelvic - normal external genitalia, vulva, vagina, cervix, uterus and adnexa,  VULVA: normal appearing vulva with no masses, tenderness or lesions,  VAGINA: normal appearing vagina with normal color and discharge, no lesions, IUD string not visible, Ultrasound to ensure IUD in proper location.   CERVIX: nontender,  UTERUS: Uterus is moderately enlarged. 8 weeks size, anteflexed ADNEXA: normal adnexa in  size, nontender and no masses Transvaginal ultrasound shows: anteflex and anteverted uterus; uterine length is 11.8 cm, slightly enlarged  Assessment & Plan:   A:  1. Expelled IUD 2 heavy menses 3. anemial due to blood loss P:  1. Rx mini pill Micronor daily     This chart was scribed for Sarah Kind, MD by Stephania Fragmin, ED Scribe. This patient was seen in room Room 2 and the patient's care was started at 11:37 AM. I personally performed the services described in this documentation, which was SCRIBED in my presence. The recorded information has been reviewed and considered accurate. It has been edited as necessary during review. Sarah Kind, MD   .

## 2014-11-15 NOTE — Progress Notes (Signed)
Patient ID: Sarah Foley, female   DOB: March 06, 1971, 44 y.o.   MRN: [id number] Pt here today for IUD check and pain on her left side.

## 2014-12-06 ENCOUNTER — Ambulatory Visit: Payer: Self-pay | Admitting: Obstetrics and Gynecology

## 2015-02-06 ENCOUNTER — Other Ambulatory Visit: Payer: Self-pay | Admitting: Obstetrics and Gynecology

## 2015-02-18 ENCOUNTER — Ambulatory Visit: Payer: Self-pay | Admitting: Obstetrics and Gynecology

## 2016-03-30 DIAGNOSIS — Z139 Encounter for screening, unspecified: Secondary | ICD-10-CM

## 2016-03-30 LAB — GLUCOSE, POCT (MANUAL RESULT ENTRY): POC Glucose: 122 mg/dl — AB (ref 70–99)

## 2016-03-30 NOTE — Congregational Nurse Program (Signed)
Congregational Nurse Program Note  Date of Encounter: 03/30/2016  Past Medical History: Past Medical History:  Diagnosis Date  . Anemia   . Cataract    Leye  . Depression   . No pertinent past medical history     Encounter Details:     CNP Questionnaire - 03/30/16 1000      Patient Demographics   Is this a new or existing patient? New   Patient is considered a/an Immigrant   Race Latino/Hispanic     Patient Assistance   Location of Patient [place]   Patient's financial/insurance status Low Income;Self-Pay (Uninsured)   Uninsured Patient ([place]) Yes   Interventions Counseled to make appt. with provider;Assisted patient in making appt.   Patient referred to apply for the following financial assistance Not Applicable   Food insecurities addressed Not Applicable   Transportation assistance No   Assistance securing medications No   Educational health offerings Behavioral health;Chronic disease;Navigating the healthcare system     Encounter Details   Primary purpose of visit Chronic Illness/Condition Visit;Education/Health Concerns;Navigating the Healthcare System   Was an Emergency Department visit averted? Not Applicable   Does patient have a medical provider? No   Patient referred to Establish PCP;Clinic;Other   Was a mental health screening completed? (GAINS tool) No   Does patient have dental issues? No   Does patient have vision issues? Yes  blurry vision with reading, she uses 150 strength readers   Was a vision referral made? No   Does your patient have an abnormal blood pressure today? No   Since previous encounter, have you referred patient for abnormal blood pressure that resulted in a new diagnosis or medication change? No   Does your patient have an abnormal blood glucose today? No   Since previous encounter, have you referred patient for abnormal blood glucose that resulted in a new diagnosis or medication change? No   Was there a life-saving intervention made? No     New client to [facility], Had previously been screened and seen by [place] and had been referred to the [facility] at that time, over 1 year ago. Client states she has not seen a provider in over a year.  PMH: Iron defficency anemia, depression . Current medications. Ferrous sulfate(OTC) Client states she is taking daily an old prescription of anitdepressant that was prescribed by the provider at the [facility] , but she doesn't know the name. She states she has been taking it again daily for about 3 weeks . Past surgical history: bilateral cataract surgery 9 years ago. Tubal ligation 5 years ago per client. No Known Allergies Chief concern today is needing a Primary Care provider. Client complains of crying frequently , but denies thoughts of suicide. She states she has good support. She states that is the reason she began taking the depression medication again for 3 weeks now(unknown name) and she states it is helping. Client reports to having increased stress. RN discussed with client that she has options for counseling and that if she decides that she would like to seek those options to call RN with interpreter or discuss with her Primary care provider. RN also gave client contact information in Spanish for [employer]. Last Mammogram 2016, Last PAP 2016 and LMP reported as 02/2016.  Client requests to be referred back into the [facility]. Referral made and first appointment secured for 04/02/16 at 10:45 am.  Will follow up with client after her  first primary care appointment for any further needs.  Client currently lives with her husband and children and her husband is working. She is not working and cares for the home and children.  Interview conducted with the assistance of Demetrius Revel medical interpreter and also Polo Riley LPN .

## 2016-04-02 ENCOUNTER — Encounter: Payer: Self-pay | Admitting: Physician Assistant

## 2016-04-02 ENCOUNTER — Ambulatory Visit: Payer: Self-pay | Admitting: Physician Assistant

## 2016-04-02 VITALS — BP 98/60 | HR 74 | Temp 97.9°F | Ht 63.25 in | Wt 203.0 lb

## 2016-04-02 DIAGNOSIS — E669 Obesity, unspecified: Secondary | ICD-10-CM

## 2016-04-02 DIAGNOSIS — D509 Iron deficiency anemia, unspecified: Secondary | ICD-10-CM

## 2016-04-02 DIAGNOSIS — F329 Major depressive disorder, single episode, unspecified: Secondary | ICD-10-CM

## 2016-04-02 DIAGNOSIS — Z6835 Body mass index (BMI) 35.0-35.9, adult: Secondary | ICD-10-CM

## 2016-04-02 DIAGNOSIS — Z131 Encounter for screening for diabetes mellitus: Secondary | ICD-10-CM

## 2016-04-02 DIAGNOSIS — Z13220 Encounter for screening for lipoid disorders: Secondary | ICD-10-CM

## 2016-04-02 DIAGNOSIS — E66812 Obesity, class 2: Secondary | ICD-10-CM | POA: Insufficient documentation

## 2016-04-02 DIAGNOSIS — F32A Depression, unspecified: Secondary | ICD-10-CM

## 2016-04-02 MED ORDER — CITALOPRAM HYDROBROMIDE 20 MG PO TABS: ORAL_TABLET | ORAL | 1 refills | Status: DC

## 2016-04-02 NOTE — Progress Notes (Signed)
BP 98/60 (BP Location: Left Arm, Patient Position: Sitting, Cuff Size: Large)   Pulse 74   Temp 97.9 F (36.6 C)   Ht 5' 3.25" (1.607 m)   Wt 203 lb (92.1 kg)   LMP 02/29/2016 (Approximate)   SpO2 99%   BMI 35.68 kg/m    Subjective:    Patient ID: Sarah Foley, female    DOB: December 03, 1970, 45 y.o.   MRN: [id number]  HPI: Sarah Foley is a 45 y.o. female presenting on 04/02/2016 for New Patient (Initial Visit) and Depression   HPI  Pt was given rx for ocp after IUD expelled last year but she says she didn't know that.  She no-showed for her f/u with gyn  Pt is taking some old leftover citalopram that she had from when she was on it 1 1/2 year ago.    She says she had a problem that came up that is still going on.  Pt states she feels much better since getting back on the citalopram.  No SI or HI.   Relevant past medical, surgical, family and social history reviewed and updated as indicated. Interim medical history since our last visit reviewed. Allergies and medications reviewed and updated.   Current Outpatient Prescriptions:  .  citalopram (CELEXA) 20 MG tablet, Take 20 mg by mouth daily., Disp: , Rfl:  .  ferrous sulfate 325 (65 FE) MG tablet, Take 325 mg by mouth daily with breakfast., Disp: , Rfl:   Review of Systems  Constitutional: Negative for appetite change, chills, diaphoresis, fatigue, fever and unexpected weight change.  HENT: Negative for congestion, dental problem, drooling, ear pain, facial swelling, hearing loss, mouth sores, sneezing, sore throat, trouble swallowing and voice change.   Eyes: Negative for pain, discharge, redness, itching and visual disturbance.  Respiratory: Negative for cough, choking, shortness of breath and wheezing.   Cardiovascular: Negative for chest pain, palpitations and leg swelling.  Gastrointestinal: Negative for abdominal pain, blood in stool, constipation, diarrhea and vomiting.  Endocrine: Negative for cold  intolerance, heat intolerance and polydipsia.  Genitourinary: Negative for decreased urine volume, dysuria and hematuria.  Musculoskeletal: Negative for arthralgias, back pain and gait problem.  Skin: Negative for rash.  Allergic/Immunologic: Negative for environmental allergies.  Neurological: Negative for seizures, syncope, light-headedness and headaches.  Hematological: Negative for adenopathy.  Psychiatric/Behavioral: Negative for agitation, dysphoric mood and suicidal ideas. The patient is not nervous/anxious.     Per HPI unless specifically indicated above     Objective:    BP 98/60 (BP Location: Left Arm, Patient Position: Sitting, Cuff Size: Large)   Pulse 74   Temp 97.9 F (36.6 C)   Ht 5' 3.25" (1.607 m)   Wt 203 lb (92.1 kg)   LMP 02/29/2016 (Approximate)   SpO2 99%   BMI 35.68 kg/m   Wt Readings from Last 3 Encounters:  04/02/16 203 lb (92.1 kg)  03/30/16 202 lb 3.2 oz (91.7 kg)  11/15/14 203 lb 8 oz (92.3 kg)    Physical Exam  Constitutional: She is oriented to person, place, and time. She appears well-developed and well-nourished.  HENT:  Head: Normocephalic and atraumatic.  Neck: Neck supple.  Cardiovascular: Normal rate and regular rhythm.   Pulmonary/Chest: Effort normal and breath sounds normal.  Abdominal: Soft. Bowel sounds are normal. She exhibits no mass. There is no hepatosplenomegaly. There is no tenderness.  Musculoskeletal: She exhibits no edema.  Lymphadenopathy:    She has no cervical adenopathy.  Neurological: She is alert and  oriented to person, place, and time.  Skin: Skin is warm and dry.  Psychiatric: She has a normal mood and affect. Her behavior is normal.  Vitals reviewed.   Results for orders placed or performed in visit on 03/30/16  POCT glucose  Result Value Ref Range   POC Glucose 122 (A) 70 - 99 mg/dl      Assessment & Plan:    Encounter Diagnoses  Name Primary?  . Depression, unspecified depression type Yes  . Iron  deficiency anemia, unspecified iron deficiency anemia type   . Class 2 obesity with body mass index (BMI) of 35.0 to 35.9 in adult, unspecified obesity type, unspecified whether serious comorbidity present   . Screening cholesterol level   . Screening for diabetes mellitus     -rx citalopram given -get fasting labs -follow up one month.  RTO sooner prn

## 2016-04-06 ENCOUNTER — Telehealth: Payer: Self-pay

## 2016-04-06 NOTE — Telephone Encounter (Signed)
Call was made to patient on 04/06/16 as a follow-up call.  Pt stated she is doing good and the her appointment at the [facility] went well.  Pt mentioned she will be going tomorrow 12/12 to get some labs drawn.  Pt was glad for the follow up call.  Jayshun Galentine R. [place], LPN [id number]

## 2016-04-08 ENCOUNTER — Other Ambulatory Visit: Payer: Self-pay | Admitting: Physician Assistant

## 2016-04-08 DIAGNOSIS — D509 Iron deficiency anemia, unspecified: Secondary | ICD-10-CM

## 2016-04-08 LAB — COMPREHENSIVE METABOLIC PANEL
ALK PHOS: 48 U/L (ref 33–115)
ALT: 16 U/L (ref 6–29)
AST: 17 U/L (ref 10–30)
Albumin: 3.7 g/dL (ref 3.6–5.1)
BILIRUBIN TOTAL: 0.2 mg/dL (ref 0.2–1.2)
BUN: 12 mg/dL (ref 7–25)
CO2: 28 mmol/L (ref 20–31)
Calcium: 8.5 mg/dL — ABNORMAL LOW (ref 8.6–10.2)
Chloride: 106 mmol/L (ref 98–110)
Creat: 0.65 mg/dL (ref 0.50–1.10)
Glucose, Bld: 90 mg/dL (ref 65–99)
Potassium: 4.2 mmol/L (ref 3.5–5.3)
Sodium: 138 mmol/L (ref 135–146)
Total Protein: 6.4 g/dL (ref 6.1–8.1)

## 2016-04-08 LAB — CBC
HEMATOCRIT: 26.7 % — AB (ref 35.0–45.0)
HEMOGLOBIN: 7.4 g/dL — AB (ref 11.7–15.5)
MCH: 18.3 pg — ABNORMAL LOW (ref 27.0–33.0)
MCHC: 27.7 g/dL — ABNORMAL LOW (ref 32.0–36.0)
MCV: 65.9 fL — ABNORMAL LOW (ref 80.0–100.0)
MPV: 8.3 fL (ref 7.5–12.5)
Platelets: 403 10*3/uL — ABNORMAL HIGH (ref 140–400)
RBC: 4.05 MIL/uL (ref 3.80–5.10)
RDW: 17 % — ABNORMAL HIGH (ref 11.0–15.0)
WBC: 6.3 10*3/uL (ref 3.8–10.8)

## 2016-04-08 LAB — LIPID PANEL
Cholesterol: 161 mg/dL (ref <200)
HDL: 53 mg/dL (ref >50)
LDL Cholesterol: 88 mg/dL (ref <100)
Total CHOL/HDL Ratio: 3 Ratio (ref <5.0)
Triglycerides: 99 mg/dL (ref <150)
VLDL: 20 mg/dL (ref <30)

## 2016-04-08 LAB — HEMOGLOBIN A1C
Hgb A1c MFr Bld: 5.3 % (ref <5.7)
MEAN PLASMA GLUCOSE: 105 mg/dL

## 2016-04-28 ENCOUNTER — Other Ambulatory Visit: Payer: Self-pay | Admitting: Student

## 2016-04-28 DIAGNOSIS — D509 Iron deficiency anemia, unspecified: Secondary | ICD-10-CM

## 2016-04-29 LAB — HEMOGLOBIN: Hemoglobin: 8.1 g/dL — ABNORMAL LOW (ref 11.7–15.5)

## 2016-04-29 LAB — HEMATOCRIT: HCT: 29 % — ABNORMAL LOW (ref 35.0–45.0)

## 2016-05-04 ENCOUNTER — Ambulatory Visit: Payer: Self-pay | Admitting: Physician Assistant

## 2016-05-04 ENCOUNTER — Encounter: Payer: Self-pay | Admitting: Physician Assistant

## 2016-05-04 VITALS — BP 118/62 | HR 74 | Temp 97.9°F | Ht 63.25 in | Wt 206.5 lb

## 2016-05-04 DIAGNOSIS — F329 Major depressive disorder, single episode, unspecified: Secondary | ICD-10-CM

## 2016-05-04 DIAGNOSIS — F32A Depression, unspecified: Secondary | ICD-10-CM

## 2016-05-04 DIAGNOSIS — D649 Anemia, unspecified: Secondary | ICD-10-CM

## 2016-05-04 DIAGNOSIS — E669 Obesity, unspecified: Secondary | ICD-10-CM

## 2016-05-04 DIAGNOSIS — N920 Excessive and frequent menstruation with regular cycle: Secondary | ICD-10-CM

## 2016-05-04 DIAGNOSIS — Z6836 Body mass index (BMI) 36.0-36.9, adult: Secondary | ICD-10-CM

## 2016-05-04 NOTE — Progress Notes (Signed)
BP 118/62 (BP Location: Left Arm, Patient Position: Sitting, Cuff Size: Large)   Pulse 74   Temp 97.9 F (36.6 C)   Ht 5' 3.25" (1.607 m)   Wt 206 lb 8 oz (93.7 kg)   SpO2 98%   BMI 36.29 kg/m    Subjective:    Patient ID: Sarah Foley, female    DOB: January 13, 1971, 46 y.o.   MRN: [id number]  HPI: Sarah Foley is a 46 y.o. female presenting on 05/04/2016 for Mental Health Problem and Anemia   HPI   Pt states mood is doing well.  No episodes of tearfulness or feelings of depression. Her "situation" at home that she doesn't want to talk about is improving.   Relevant past medical, surgical, family and social history reviewed and updated as indicated. Interim medical history since our last visit reviewed. Allergies and medications reviewed and updated.   Current Outpatient Prescriptions:  .  citalopram (CELEXA) 20 MG tablet, 1 po qd.  Tome una tableta por boca diaria, Disp: 30 tablet, Rfl: 1 .  ferrous sulfate 325 (65 FE) MG tablet, Take 325 mg by mouth 2 (two) times daily. , Disp: , Rfl:  .  vitamin C (ASCORBIC ACID) 500 MG tablet, Take 500 mg by mouth daily., Disp: , Rfl:    Review of Systems  Constitutional: Positive for fatigue. Negative for appetite change, chills, diaphoresis, fever and unexpected weight change.  HENT: Positive for dental problem. Negative for congestion, drooling, ear pain, facial swelling, hearing loss, mouth sores, sneezing, sore throat, trouble swallowing and voice change.   Eyes: Negative for pain, discharge, redness, itching and visual disturbance.  Respiratory: Negative for cough, choking, shortness of breath and wheezing.   Cardiovascular: Negative for chest pain, palpitations and leg swelling.  Gastrointestinal: Negative for abdominal pain, blood in stool, constipation, diarrhea and vomiting.  Endocrine: Negative for cold intolerance, heat intolerance and polydipsia.  Genitourinary: Negative for decreased urine volume, dysuria  and hematuria.  Musculoskeletal: Negative for arthralgias, back pain and gait problem.  Skin: Negative for rash.  Allergic/Immunologic: Negative for environmental allergies.  Neurological: Negative for seizures, syncope, light-headedness and headaches.  Hematological: Negative for adenopathy.  Psychiatric/Behavioral: Negative for agitation, dysphoric mood and suicidal ideas. The patient is not nervous/anxious.     Per HPI unless specifically indicated above     Objective:    BP 118/62 (BP Location: Left Arm, Patient Position: Sitting, Cuff Size: Large)   Pulse 74   Temp 97.9 F (36.6 C)   Ht 5' 3.25" (1.607 m)   Wt 206 lb 8 oz (93.7 kg)   SpO2 98%   BMI 36.29 kg/m   Wt Readings from Last 3 Encounters:  05/04/16 206 lb 8 oz (93.7 kg)  04/02/16 203 lb (92.1 kg)  03/30/16 202 lb 3.2 oz (91.7 kg)    Physical Exam  Constitutional: She is oriented to person, place, and time. She appears well-developed and well-nourished.  HENT:  Head: Normocephalic and atraumatic.  Neck: Neck supple.  Cardiovascular: Normal rate and regular rhythm.   Pulmonary/Chest: Effort normal and breath sounds normal.  Abdominal: Soft. Bowel sounds are normal. She exhibits no mass. There is no hepatosplenomegaly. There is no tenderness.  Musculoskeletal: She exhibits no edema.  Lymphadenopathy:    She has no cervical adenopathy.  Neurological: She is alert and oriented to person, place, and time.  Skin: Skin is warm and dry.  Psychiatric: She has a normal mood and affect. Her behavior is normal.  Vitals reviewed.  Results for orders placed or performed in visit on 04/28/16  Hematocrit  Result Value Ref Range   HCT 29.0 (L) 35.0 - 45.0 %  Hemoglobin  Result Value Ref Range   Hemoglobin 8.1 (L) 11.7 - 15.5 g/dL      Assessment & Plan:    Encounter Diagnoses  Name Primary?  . Depression, unspecified depression type Yes  . Anemia, unspecified type   . Menorrhagia with regular cycle   . Class  2 obesity with body mass index (BMI) of 36.0 to 36.9 in adult, unspecified obesity type, unspecified whether serious comorbidity present     -Reviewed labs with pt.  H/h still low but much improved from December.  -pt to Continue citalopram for mood -Discussed returning to gyn for heavy menses.  She says yes. Will order referral  -pt turned in her [facility] discount application about 1 1/2 week ago -F/u 6 wk to recheck h/h and make sure she has gyn appt

## 2016-06-09 ENCOUNTER — Other Ambulatory Visit: Payer: Self-pay | Admitting: Student

## 2016-06-09 DIAGNOSIS — N920 Excessive and frequent menstruation with regular cycle: Secondary | ICD-10-CM

## 2016-06-09 DIAGNOSIS — D649 Anemia, unspecified: Secondary | ICD-10-CM

## 2016-06-12 LAB — HEMATOCRIT: HCT: 31 % — ABNORMAL LOW (ref 35.0–45.0)

## 2016-06-12 LAB — HEMOGLOBIN: Hemoglobin: 9 g/dL — ABNORMAL LOW (ref 11.7–15.5)

## 2016-06-16 ENCOUNTER — Ambulatory Visit: Payer: Self-pay | Admitting: Physician Assistant

## 2016-06-16 ENCOUNTER — Encounter: Payer: Self-pay | Admitting: Physician Assistant

## 2016-06-16 VITALS — BP 118/72 | HR 63 | Temp 97.9°F | Ht 63.25 in | Wt 201.5 lb

## 2016-06-16 DIAGNOSIS — D649 Anemia, unspecified: Secondary | ICD-10-CM

## 2016-06-16 DIAGNOSIS — N920 Excessive and frequent menstruation with regular cycle: Secondary | ICD-10-CM

## 2016-06-16 DIAGNOSIS — Z6835 Body mass index (BMI) 35.0-35.9, adult: Secondary | ICD-10-CM

## 2016-06-16 DIAGNOSIS — E66812 Obesity, class 2: Secondary | ICD-10-CM

## 2016-06-16 DIAGNOSIS — E669 Obesity, unspecified: Secondary | ICD-10-CM

## 2016-06-16 DIAGNOSIS — F329 Major depressive disorder, single episode, unspecified: Secondary | ICD-10-CM

## 2016-06-16 DIAGNOSIS — F32A Depression, unspecified: Secondary | ICD-10-CM

## 2016-06-16 NOTE — Progress Notes (Signed)
BP 118/72 (BP Location: Left Arm, Patient Position: Sitting, Cuff Size: Large)   Pulse 63   Temp 97.9 F (36.6 C) (Other (Comment))   Ht 5' 3.25" (1.607 m)   Wt 201 lb 8 oz (91.4 kg)   SpO2 99%   BMI 35.41 kg/m    Subjective:    Patient ID: Sarah Foley, female    DOB: 26-Jun-1970, 46 y.o.   MRN: [id number]  HPI: Jeanene Morici is a 46 y.o. female presenting on 06/16/2016 for Anemia and Mental Health Problem   HPI  Pt still doesn't have appointment with gyn.  Nurse called Caryl Pina about the discount application and pt still has papers that she needs to turn in for the application to be complete.  Nurse relayed information to pt and told pt to contact [facility] when she has done this so we can see that she gets scheduled.  Pt is feeling well today  Relevant past medical, surgical, family and social history reviewed and updated as indicated. Interim medical history since our last visit reviewed. Allergies and medications reviewed and updated.   Current Outpatient Prescriptions:  .  citalopram (CELEXA) 20 MG tablet, 1 po qd.  Tome una tableta por boca diaria, Disp: 30 tablet, Rfl: 1 .  ferrous sulfate 325 (65 FE) MG tablet, Take 325 mg by mouth 2 (two) times daily. , Disp: , Rfl:  .  vitamin C (ASCORBIC ACID) 500 MG tablet, Take 500 mg by mouth daily., Disp: , Rfl:    Review of Systems  Constitutional: Negative for appetite change, chills, diaphoresis, fatigue, fever and unexpected weight change.  HENT: Positive for dental problem. Negative for congestion, drooling, ear pain, facial swelling, hearing loss, mouth sores, sneezing, sore throat, trouble swallowing and voice change.   Eyes: Negative for pain, discharge, redness, itching and visual disturbance.  Respiratory: Negative for cough, choking, shortness of breath and wheezing.   Cardiovascular: Negative for chest pain, palpitations and leg swelling.  Gastrointestinal: Negative for abdominal pain, blood in  stool, constipation, diarrhea and vomiting.  Endocrine: Negative for cold intolerance, heat intolerance and polydipsia.  Genitourinary: Negative for decreased urine volume, dysuria and hematuria.  Musculoskeletal: Negative for arthralgias, back pain and gait problem.  Skin: Negative for rash.  Allergic/Immunologic: Negative for environmental allergies.  Neurological: Negative for seizures, syncope, light-headedness and headaches.  Hematological: Negative for adenopathy.  Psychiatric/Behavioral: Negative for agitation, dysphoric mood and suicidal ideas. The patient is not nervous/anxious.     Per HPI unless specifically indicated above     Objective:    BP 118/72 (BP Location: Left Arm, Patient Position: Sitting, Cuff Size: Large)   Pulse 63   Temp 97.9 F (36.6 C) (Other (Comment))   Ht 5' 3.25" (1.607 m)   Wt 201 lb 8 oz (91.4 kg)   SpO2 99%   BMI 35.41 kg/m   Wt Readings from Last 3 Encounters:  06/16/16 201 lb 8 oz (91.4 kg)  05/04/16 206 lb 8 oz (93.7 kg)  04/02/16 203 lb (92.1 kg)    Physical Exam  Constitutional: She is oriented to person, place, and time. She appears well-developed and well-nourished.  HENT:  Head: Normocephalic and atraumatic.  Neck: Neck supple.  Cardiovascular: Normal rate and regular rhythm.   Pulmonary/Chest: Effort normal and breath sounds normal.  Abdominal: Soft. Bowel sounds are normal. She exhibits no mass. There is no hepatosplenomegaly. There is no tenderness.  Musculoskeletal: She exhibits no edema.  Lymphadenopathy:    She has no cervical adenopathy.  Neurological: She is alert and oriented to person, place, and time.  Skin: Skin is warm and dry.  Psychiatric: She has a normal mood and affect. Her behavior is normal.  Vitals reviewed.   Results for orders placed or performed in visit on 06/09/16  Hematocrit  Result Value Ref Range   HCT 31.0 (L) 35.0 - 45.0 %  Hemoglobin  Result Value Ref Range   Hemoglobin 9.0 (L) 11.7 - 15.5  g/dL      Assessment & Plan:    Encounter Diagnoses  Name Primary?  . Depression, unspecified depression type Yes  . Anemia, unspecified type   . Menorrhagia with regular cycle   . Class 2 obesity with body mass index (BMI) of 35.0 to 35.9 in adult, unspecified obesity type, unspecified whether serious comorbidity present     -Reviewed labs with pt -pt to Continue iron -F/u 1 month to make sure pt has appointment with gyn

## 2016-06-29 ENCOUNTER — Other Ambulatory Visit: Payer: Self-pay | Admitting: Physician Assistant

## 2016-06-29 ENCOUNTER — Ambulatory Visit: Payer: Self-pay | Admitting: Physician Assistant

## 2016-06-29 ENCOUNTER — Encounter: Payer: Self-pay | Admitting: Physician Assistant

## 2016-06-29 VITALS — BP 126/78 | HR 63 | Temp 97.9°F | Ht 63.25 in | Wt 199.0 lb

## 2016-06-29 DIAGNOSIS — R21 Rash and other nonspecific skin eruption: Secondary | ICD-10-CM

## 2016-06-29 MED ORDER — PREDNISONE 10 MG PO TABS: ORAL_TABLET | ORAL | 0 refills | Status: AC

## 2016-06-29 NOTE — Progress Notes (Signed)
BP 126/78 (BP Location: Left Arm, Patient Position: Sitting, Cuff Size: Normal)   Pulse 63   Temp 97.9 F (36.6 C)   Ht 5' 3.25" (1.607 m)   Wt 199 lb (90.3 kg)   SpO2 98%   BMI 34.97 kg/m    Subjective:    Patient ID: Sarah Foley, female    DOB: 1971/03/29, 46 y.o.   MRN: [id number]  HPI: Sarah Foley is a 46 y.o. female presenting on 06/29/2016 for Rash (Rash began last Thursday. pt started taking Nutrilite last Monday. pt unsure if she is consuming to many vitamins and it is causing her harm. pt states she also got [place] product called bedoyecta about 20 days ago. pt states she has applied alcohol to rash and is not very helpful. pt states very itchy. pt states rash on her arms, abdomen, and legs.)   HPI   Chief Complaint  Patient presents with  . Rash    Rash began last Thursday. pt started taking Nutrilite last Monday. pt unsure if she is consuming to many vitamins and it is causing her harm. pt states she also got [place] product called bedoyecta about 20 days ago. pt states she has applied alcohol to rash and is not very helpful. pt states very itchy. pt states rash on her arms, abdomen, and legs.   OTCs currently using:  Nutrilite Daily vitamin and mineral packets, Nutrilite Iron and Folic Acid, ZEAL nutritional drink mix, vitamin C.  Relevant past medical, surgical, family and social history reviewed and updated as indicated. Interim medical history since our last visit reviewed. Allergies and medications reviewed and updated.   Current Outpatient Prescriptions:  .  OVER THE COUNTER MEDICATION, , Disp: , Rfl:  .  OVER THE COUNTER MEDICATION, , Disp: , Rfl:  .  OVER THE COUNTER MEDICATION, , Disp: , Rfl:  .  vitamin C (ASCORBIC ACID) 500 MG tablet, Take 500 mg by mouth daily., Disp: , Rfl:  .  citalopram (CELEXA) 20 MG tablet, 1 po qd.  Tome una tableta por boca diaria (Patient not taking: Reported on 06/29/2016), Disp: 30 tablet, Rfl:  1   Review of Systems  Constitutional: Negative for appetite change, chills, diaphoresis, fatigue, fever and unexpected weight change.  HENT: Negative for congestion, drooling, ear pain, facial swelling, hearing loss, mouth sores, sneezing, sore throat, trouble swallowing and voice change.   Eyes: Negative for pain, discharge, redness, itching and visual disturbance.  Respiratory: Negative for cough, choking, shortness of breath and wheezing.   Cardiovascular: Negative for chest pain, palpitations and leg swelling.  Gastrointestinal: Negative for abdominal pain, blood in stool, constipation, diarrhea and vomiting.  Endocrine: Negative for cold intolerance, heat intolerance and polydipsia.  Genitourinary: Negative for decreased urine volume, dysuria and hematuria.  Musculoskeletal: Negative for arthralgias, back pain and gait problem.  Skin: Positive for rash.  Allergic/Immunologic: Negative for environmental allergies.  Neurological: Negative for seizures, syncope, light-headedness and headaches.  Hematological: Negative for adenopathy.  Psychiatric/Behavioral: Negative for agitation, dysphoric mood and suicidal ideas. The patient is not nervous/anxious.     Per HPI unless specifically indicated above     Objective:    BP 126/78 (BP Location: Left Arm, Patient Position: Sitting, Cuff Size: Normal)   Pulse 63   Temp 97.9 F (36.6 C)   Ht 5' 3.25" (1.607 m)   Wt 199 lb (90.3 kg)   SpO2 98%   BMI 34.97 kg/m   Wt Readings from Last 3 Encounters:  06/29/16 199  lb (90.3 kg)  06/16/16 201 lb 8 oz (91.4 kg)  05/04/16 206 lb 8 oz (93.7 kg)    Physical Exam  Constitutional: She is oriented to person, place, and time. She appears well-developed and well-nourished.  HENT:  Head: Normocephalic and atraumatic.  Neck: Neck supple.  Cardiovascular: Normal rate and regular rhythm.   Pulmonary/Chest: Effort normal and breath sounds normal.  Abdominal: Soft. Bowel sounds are normal. She  exhibits no mass. There is no hepatosplenomegaly. There is no tenderness.  Musculoskeletal: She exhibits no edema.  Lymphadenopathy:    She has no cervical adenopathy.  Neurological: She is alert and oriented to person, place, and time.  Skin: Skin is warm and dry. Rash noted.  Fine rash mostly on BUE and shoulders.  Little bit maybe on BLE and torso.   Psychiatric: She has a normal mood and affect. Her behavior is normal.  Vitals reviewed.   Results for orders placed or performed in visit on 06/09/16  Hematocrit  Result Value Ref Range   HCT 31.0 (L) 35.0 - 45.0 %  Hemoglobin  Result Value Ref Range   Hemoglobin 9.0 (L) 11.7 - 15.5 g/dL      Assessment & Plan:    Encounter Diagnosis  Name Primary?  . Rash Yes     Counseled not to use alcohol all over her body -discussed she has No need for supplements provided she follow well-balanced eating plan -Can take one MVI daily if desired.   Also, she Needs to take iron daily for anemia -rx prednisone and benedryl for rash -F/u as scheduled.  RTO sooner prn

## 2016-06-29 NOTE — Patient Instructions (Addendum)
Over the counter BENEDRYL as needed for itching   Benedryl sin receta en la farmacia cuando sea necesario para la comezon.     Erupcin cutnea (Rash) Una erupcin cutnea es un cambio en el color de la piel. Una erupcin tambin puede cambiar la forma en que se siente la piel. Hay muchas afecciones y [employer] que pueden causar una erupcin. INSTRUCCIONES PARA EL CUIDADO EN EL HOGAR Est atento a cualquier cambio en los sntomas. Estas indicaciones pueden ayudarlo con el trastorno: [employer] o aplquese los medicamentos de venta libre y Editor, commissioning como se lo haya indicado el mdico. Estos pueden incluir lo siguiente:  Crema con corticoides.  Lociones para Barrister's clerk.  Antihistamnicos por va oral. Cuidado de la piel  Aplique compresas fras en las zonas afectadas.  Trate de tomar un bao con lo siguiente:  Sales de [place]. Siga las instrucciones del envase. Puede conseguirlas en la tienda de comestibles o la farmacia local.  Bicarbonato de sodio. Vierta un poco en la baera como se lo haya indicado el [place] instrucciones del envase. Puede conseguirla en la tienda de comestibles o la farmacia local.  Intente colocarse una pasta de bicarbonato de sodio sobre la piel. Agregue agua al bicarbonato hasta que tenga la consistencia de una pasta.  No se rasque ni se refriegue la piel.  Evite cubrir la erupcin. Asegrese de que la erupcin est expuesta al aire todo lo posible. Instrucciones generales  Evite los baos de inmersin y las duchas calientes, que pueden empeorar la picazn. Ardelia Mems ducha fra puede aliviar.  Evite los detergentes y los jabones perfumados, y los perfumes. Utilice jabones, detergentes, perfumes y cosmticos suaves.  Evite las sustancias que causan la erupcin. Lleve un diario como ayuda para registrar lo que le causa erupcin. Escriba los siguientes datos:  Lo que come.  Los cosmticos que  [place].  Lo que bebe.  La ropa que [place]. [place] alhajas.  Concurra a todas las visitas de control como se lo haya indicado el mdico. Esto es importante. SOLICITE ATENCIN MDICA SI:  Philbert Riser de noche.  Pierde peso.  Orina ms de lo normal.  Se siente dbil.  Vomita.  Tiene un color amarillo en la piel o en la zona blanca del ojo (ictericia).  La piel:  Siente hormigueos.  Se adormece.  La erupcin:  No desaparece despus de [employer].  [place].  Usted:  Est inusualmente sediento.  Est ms cansado que lo habitual.  Tiene los siguientes sntomas:  Sntomas nuevos.  Dolor en el abdomen.  Cristy Hilts.  Diarrea. SOLICITE ATENCIN [place] DE INMEDIATO SI:  Presenta una erupcin que cubre todo el cuerpo o la mayor parte de [place]. La erupcin puede ser dolorosa o no.  Tiene ampollas que tienen las siguientes caractersticas:  Se ubican sobre la erupcin.  Se agrandan o crecen juntas.  Son dolorosas.  Estn dentro de la nariz o la boca.  Le aparece una erupcin que tiene las siguientes caractersticas:  Tiene pequeas manchas moradas, como si fueran pinchazos, en todo el cuerpo.  Tiene un aspecto parecido a Earna Coder o a un blanco de tiro.  No est relacionada con una exposicin al sol, est enrojecida y duele, y produce descamacin de la piel. Esta informacin no tiene Marine scientist el consejo del mdico. Asegrese de hacerle al mdico cualquier pregunta que tenga. Document Released: 01/21/2005 Document Revised: 08/05/2015 Document Reviewed: 08/29/2014 Elsevier Interactive Patient Education  2017 Elsevier  Inc.  

## 2016-07-14 ENCOUNTER — Ambulatory Visit: Payer: Self-pay | Admitting: Physician Assistant

## 2016-07-14 ENCOUNTER — Encounter: Payer: Self-pay | Admitting: Physician Assistant

## 2016-07-14 VITALS — BP 110/60 | HR 87 | Temp 97.7°F | Ht 63.25 in | Wt 201.5 lb

## 2016-07-14 DIAGNOSIS — E669 Obesity, unspecified: Secondary | ICD-10-CM

## 2016-07-14 DIAGNOSIS — Z6835 Body mass index (BMI) 35.0-35.9, adult: Secondary | ICD-10-CM

## 2016-07-14 DIAGNOSIS — F329 Major depressive disorder, single episode, unspecified: Secondary | ICD-10-CM

## 2016-07-14 DIAGNOSIS — F32A Depression, unspecified: Secondary | ICD-10-CM

## 2016-07-14 DIAGNOSIS — D649 Anemia, unspecified: Secondary | ICD-10-CM

## 2016-07-14 DIAGNOSIS — N920 Excessive and frequent menstruation with regular cycle: Secondary | ICD-10-CM

## 2016-07-14 NOTE — Progress Notes (Signed)
BP 110/60 (BP Location: Left Arm, Patient Position: Sitting, Cuff Size: Normal)   Pulse 87   Temp 97.7 F (36.5 C)   Ht 5' 3.25" (1.607 m)   Wt 201 lb 8 oz (91.4 kg)   SpO2 99%   BMI 35.41 kg/m    Subjective:    Patient ID: Sarah Foley, female    DOB: 25-May-1970, 46 y.o.   MRN: [id number]  HPI: Sarah Foley is a 46 y.o. female presenting on 07/14/2016 for Mental Health Problem and Anemia   HPI   appointmet today mostly to make sure pt has appt with gyn.    Pt states mood better on the citalopram  Relevant past medical, surgical, family and social history reviewed and updated as indicated. Interim medical history since our last visit reviewed. Allergies and medications reviewed and updated.   Current Outpatient Prescriptions:  .  citalopram (CELEXA) 20 MG tablet, 1 po qd.  Tome una tableta por boca diaria, Disp: 30 tablet, Rfl: 1 .  IRON PO, Take 1 tablet by mouth daily., Disp: , Rfl:  .  vitamin C (ASCORBIC ACID) 500 MG tablet, Take 500 mg by mouth daily., Disp: , Rfl:   Review of Systems  Constitutional: Negative for appetite change, chills, diaphoresis, fatigue, fever and unexpected weight change.  HENT: Positive for dental problem. Negative for congestion, drooling, ear pain, facial swelling, hearing loss, mouth sores, sneezing, sore throat, trouble swallowing and voice change.   Eyes: Negative for pain, discharge, redness, itching and visual disturbance.  Respiratory: Negative for cough, choking, shortness of breath and wheezing.   Cardiovascular: Negative for chest pain, palpitations and leg swelling.  Gastrointestinal: Negative for abdominal pain, blood in stool, constipation, diarrhea and vomiting.  Endocrine: Negative for cold intolerance, heat intolerance and polydipsia.  Genitourinary: Negative for decreased urine volume, dysuria and hematuria.  Musculoskeletal: Negative for arthralgias, back pain and gait problem.  Skin: Negative for  rash.  Allergic/Immunologic: Negative for environmental allergies.  Neurological: Negative for seizures, syncope, light-headedness and headaches.  Hematological: Negative for adenopathy.  Psychiatric/Behavioral: Negative for agitation, dysphoric mood and suicidal ideas. The patient is not nervous/anxious.     Per HPI unless specifically indicated above     Objective:    BP 110/60 (BP Location: Left Arm, Patient Position: Sitting, Cuff Size: Normal)   Pulse 87   Temp 97.7 F (36.5 C)   Ht 5' 3.25" (1.607 m)   Wt 201 lb 8 oz (91.4 kg)   SpO2 99%   BMI 35.41 kg/m   Wt Readings from Last 3 Encounters:  07/14/16 201 lb 8 oz (91.4 kg)  06/29/16 199 lb (90.3 kg)  06/16/16 201 lb 8 oz (91.4 kg)    GAD-7 score  9 PHQ-9 score  9  Physical Exam  Constitutional: She is oriented to person, place, and time. She appears well-developed and well-nourished.  HENT:  Head: Normocephalic and atraumatic.  Neck: Neck supple.  Cardiovascular: Normal rate and regular rhythm.   Pulmonary/Chest: Effort normal and breath sounds normal.  Abdominal: Soft. Bowel sounds are normal. She exhibits no mass. There is no hepatosplenomegaly. There is no tenderness.  Musculoskeletal: She exhibits no edema.  Lymphadenopathy:    She has no cervical adenopathy.  Neurological: She is alert and oriented to person, place, and time.  Skin: Skin is warm and dry.  Psychiatric: She has a normal mood and affect. Her behavior is normal.  Vitals reviewed.   Results for orders placed or performed in visit on  06/09/16  Hematocrit  Result Value Ref Range   HCT 31.0 (L) 35.0 - 45.0 %  Hemoglobin  Result Value Ref Range   Hemoglobin 9.0 (L) 11.7 - 15.5 g/dL      Assessment & Plan:   Encounter Diagnoses  Name Primary?  Marland Kitchen Anemia, unspecified type Yes  . Menorrhagia with regular cycle   . Depression, unspecified depression type   . Class 2 obesity with body mass index (BMI) of 35.0 to 35.9 in adult, unspecified  obesity type, unspecified whether serious comorbidity present      -Check h/h.   -Will schedule gyn appt -will order screening mammogram -follow up 3 months.   RTO sooner prn

## 2016-07-15 ENCOUNTER — Encounter: Payer: Self-pay | Admitting: *Deleted

## 2016-07-27 LAB — HEMATOCRIT: HCT: 31.5 % — ABNORMAL LOW (ref 35.0–45.0)

## 2016-07-27 LAB — HEMOGLOBIN: Hemoglobin: 9.3 g/dL — ABNORMAL LOW (ref 11.7–15.5)

## 2016-08-05 ENCOUNTER — Ambulatory Visit (INDEPENDENT_AMBULATORY_CARE_PROVIDER_SITE_OTHER): Payer: Self-pay | Admitting: Obstetrics and Gynecology

## 2016-08-05 ENCOUNTER — Encounter: Payer: Self-pay | Admitting: Obstetrics and Gynecology

## 2016-08-05 DIAGNOSIS — Z3202 Encounter for pregnancy test, result negative: Secondary | ICD-10-CM

## 2016-08-05 DIAGNOSIS — N912 Amenorrhea, unspecified: Secondary | ICD-10-CM

## 2016-08-05 LAB — POCT URINE PREGNANCY: PREG TEST UR: NEGATIVE

## 2016-08-05 NOTE — Progress Notes (Signed)
   [facility] Visit  @DATE @            Patient name: Grover Woodfield MRN [id number]  Date of birth: [birth year]  CC & HPI:  Nikeya Maxim is a 46 y.o. female presenting today for heavy and prolonged periods. She was last seen in this office in July 2016 for an expelled IUD, heavy menses and resulting anemia. She was started on an OCP but patient states she did not receive this prescription. She states her periods are 10-12 days with the first 3 days being the heaviest. She reports 2 days of clots.    ROS:  ROS +heavy, prolonged bleeding -fever   Pertinent History Reviewed:   Reviewed: Significant for [facility]         Past Medical History:  Diagnosis Date  . Anemia   . Cataract    Leye  . Depression   . No pertinent past medical history                               Surgical Hx:    Past Surgical History:  Procedure Laterality Date  . CATARACT EXTRACTION, BILATERAL Bilateral 2010  . TUBAL LIGATION  06/2010   neg HCG prior to tubal +hcg 1 week after   Medications: Reviewed & Updated - see associated section                       Current Outpatient Prescriptions:  .  citalopram (CELEXA) 20 MG tablet, 1 po qd.  Tome una tableta por boca diaria, Disp: 30 tablet, Rfl: 1 .  IRON PO, Take 1 tablet by mouth daily., Disp: , Rfl:  .  vitamin C (ASCORBIC ACID) 500 MG tablet, Take 500 mg by mouth daily., Disp: , Rfl:    Social History: Reviewed -  reports that she has quit smoking. Her smoking use included Cigarettes. She quit after 1.00 year of use. She has never used smokeless tobacco.  Objective Findings:  Vitals: Blood pressure 140/72, pulse 72, height 5\' 5"  (1.651 m), weight 203 lb (92.1 kg), last menstrual period 07/04/2016.  Physical Examination: General appearance - alert, well appearing, and in no distress Mental status - alert, oriented to person, place, and time Pelvic - examination not indicated   Assessment & Plan:   A:  1. Heavy and  prolonged periods 2. Anemia due to blood loss  P:  1. Start Micronor daily  For supressing menses. 2. Follow up 3 month to check hemoglobin and ROS    By signing my name below, I, Sonum Patel, attest that this documentation has been prepared under the direction and in the presence of [name], MD. Electronically Signed: Sonum Patel, Education administrator. 08/05/16. 11:41 AM.  I personally performed the services described in this documentation, which was SCRIBED in my presence. The recorded information has been reviewed and considered accurate. It has been edited as necessary during review. [name], MD

## 2016-08-20 ENCOUNTER — Telehealth: Payer: Self-pay | Admitting: Obstetrics and Gynecology

## 2016-08-20 MED ORDER — NORETHINDRONE 0.35 MG PO TABS: 1.0000 | ORAL_TABLET | Freq: Every day | ORAL | 11 refills | Status: DC

## 2016-08-20 NOTE — Telephone Encounter (Signed)
Pt aware that Rx sent to [facility] for micronor

## 2016-08-20 NOTE — Telephone Encounter (Signed)
At last visit with Dr Glo Herring, patient states was supposed to send prescription for Micronor to suppress menses, he did not send it. Please advise.

## 2016-08-20 NOTE — Telephone Encounter (Signed)
I sent Rx for micronor to [facility] for Dr Glo Herring

## 2016-08-20 NOTE — Telephone Encounter (Signed)
Pt came into the office stating that Dr. Glo Herring was suppose to send a medication over to her pharmacy and that when she started her period for her to go and pick it up. Pt states that she has started her [facility] and they state that they do not have any prescription for her. Please contact pt

## 2016-10-14 ENCOUNTER — Ambulatory Visit: Payer: Self-pay | Admitting: Physician Assistant

## 2016-10-19 ENCOUNTER — Encounter: Payer: Self-pay | Admitting: Physician Assistant

## 2016-11-04 ENCOUNTER — Encounter: Payer: Self-pay | Admitting: *Deleted

## 2016-11-04 ENCOUNTER — Ambulatory Visit: Payer: Self-pay | Admitting: Obstetrics and Gynecology

## 2016-12-10 ENCOUNTER — Encounter: Payer: Self-pay | Admitting: Adult Health

## 2016-12-10 ENCOUNTER — Ambulatory Visit (INDEPENDENT_AMBULATORY_CARE_PROVIDER_SITE_OTHER): Payer: Self-pay | Admitting: Adult Health

## 2016-12-10 VITALS — BP 110/50 | HR 92 | Ht 65.0 in | Wt 206.0 lb

## 2016-12-10 DIAGNOSIS — N920 Excessive and frequent menstruation with regular cycle: Secondary | ICD-10-CM

## 2016-12-10 DIAGNOSIS — D649 Anemia, unspecified: Secondary | ICD-10-CM

## 2016-12-10 LAB — POCT HEMOGLOBIN: Hemoglobin: 8.4 g/dL — AB (ref 12.2–16.2)

## 2016-12-10 MED ORDER — MEGESTROL ACETATE 40 MG PO TABS: ORAL_TABLET | ORAL | 1 refills | Status: DC

## 2016-12-10 NOTE — Patient Instructions (Addendum)
Anemia inespecfica (Anemia, Nonspecific) La anemia es una enfermedad en la que la concentracin de glbulos rojos o el nivel de hemoglobina en la sangre estn por debajo de lo normal. La hemoglobina es la sustancia de los glbulos rojos que lleva el oxgeno a todo el cuerpo. La anemia da como resultado que los tejidos no reciban la cantidad suficiente de oxgeno. CAUSAS Las causas ms frecuentes de anemia son:  Sarah Foley. El sangrado puede ser interno o externo. Incluye sangrado excesivo debido al perodo (en las mujeres) o por los intestinos.  Dficit nutricional.  Enfermedad renal, tiroidea o heptica crnicas.  Enfermedades de la mdula sea que disminuyen la produccin de glbulos rojos.  Cncer y tratamientos para Science writer.  VIH, sida y sus tratamientos.  Trastornos del bazo que aumentan la destruccin de glbulos rojos.  Enfermedades de [employer].  Destruccin excesiva de glbulos rojos debido a una infeccin, a medicamentos y a Nurse, mental health. SIGNOS Y SNTOMAS  Debilidad leve.  Mareos.  Dolor de [place].  Palpitaciones.  Falta de aire, especialmente con el ejercicio.  Palidez.  Sensibilidad al fro.  Indigestin.  Nuseas.  Dificultad para dormir.  Dificultad para concentrarse. Los sntomas pueden ocurrir repentinamente o pueden Psychologist, forensic. DIAGNSTICO Con frecuencia es necesario realizar anlisis de [employer]. Estos ayudan al profesional a Adult nurse. Su mdico controlar la materia fecal para Hydrographic surveyor la presencia de [place] y buscar otras causas de prdida de [place]. TRATAMIENTO El tratamiento vara segn la causa de la anemia. Las opciones de tratamiento son:  Suplementos de hierro, vitamina B35, o cido flico.  Medicamentos con hormonas.  Transfusin de [place]. Ser necesaria en los casos de prdida de [place] grave.  Hospitalizacin. Ser necesaria si la prdida de sangre es continua  y significativa.  Cambios en la dieta.  Extirpacin del bazo. INSTRUCCIONES PARA EL CUIDADO EN EL HOGAR Cumpla con todas las visitas de control. Generalmente demora varias semanas corregir la anemia, y es muy importante que el mdico controle su enfermedad y su respuesta al [place]. SOLICITE ATENCIN MDICA DE INMEDIATO SI:  Siente debilidad extrema, falta de aire o dolor en el pecho.  Se siente mareado o tiene dificultad para concentrarse.  Tiene una hemorragia vaginal abundante.  Aparece una erupcin cutnea.  La materia fecal es negra, de aspecto alquitranado.  Se desmaya.  Vomita sangre.  Vomita repetidas veces.  Siente dolor abdominal.  Tiene fiebre o sntomas persistentes durante ms de 2 - 3 das.  Tiene fiebre y los sntomas empeoran repentinamente.  Se deshidrata.  ASEGRESE DE QUE:  Comprende estas instrucciones.  Controlar su afeccin.  Recibir ayuda de inmediato si no mejora o si empeora.  Esta informacin no tiene Marine scientist el consejo del mdico. Asegrese de hacerle al mdico cualquier pregunta que tenga. Document Released: 04/13/2005 Document Revised: 12/14/2012 Document Reviewed: 10/07/2012 Elsevier Interactive Patient Education  2017 [employer] (Menorrhagia) Se llama menorragia a los perodos menstruales abundantes o que duran ms de lo habitual. En la menorragia, la prdida de [place] y los clicos en cada perodo pueden hacerle imposible seguir con sus actividades habituales. CAUSAS En algunos casos, la causa de los perodos abundantes es desconocida, pero hay algunas afecciones que pueden causar Psychologist, educational. Las causas ms frecuentes son:  Un problema con la tiroides, que es la glndula productora de hormonas (hipotiroidismo).  Formaciones no cancerosas en el tero (plipos o fibromas).  Un desequilibrio entre las hormonas estrgeno y Immunologist.  Uno de sus ovarios no Hessie Diener  vulos durante uno o ms  meses.  Efectos secundarios por haberse colocado un dispositivo intrauterino (DIU).  Efectos secundarios por algunos medicamentos, como antiinflamatorios o anticoagulantes.  Trastornos hemorrgicos que impiden la Transport planner. SIGNOS Y SNTOMAS Durante un perodo normal, el sangrado dura entre 4 y 56 das. Los signos de que el perodo es muy abundante son:  [place] rutinaria tiene que cambiar el apsito o el tampn cada 1 o 2 horas debido a que est completamente empapado.  Elimina cogulos ms grandes de 1 pulgada (2,5 cm).  Tiene sangrado durante ms de 7 das.  Necesita usar apsitos y tampones al mismo tiempo porque pierde [employer].  Debe levantarse para cambiarse el apsito o el tampn durante la noche.  Tiene sntomas de anemia como cansancio, fatiga o falta de aire. DIAGNSTICO El [employer] har un examen fsico y le har preguntas sobre sus sntomas y su historia menstrual. Podr indicarle otros estudios segn lo que encuentre Social worker. Estos estudios pueden ser:  Anlisis de [place]. [employer] de sangre se usan para verificar si est embarazada o tiene cambios hormonales, un trastorno tiroideo o de sangrado, niveles bajos de hierro (anemia) u otros problemas.  Biopsia de endometrio. El mdico tomar [place] de tejido del interior del tero para que sea examinado con un microscopio.  Ecografa plvica. Este estudio [place] ondas de sonido para tomar imgenes del tero, los ovarios y Geneticist, molecular. Las imgenes pueden mostrar si tiene fibromas u otros crecimientos.  Histeroscopa. Para este estudio, el mdico usar un pequeo telescopio para Chemical engineer interior del tero. Segn los [employer] estudios iniciales, el mdico podr indicar ms [employer]. TRATAMIENTO Puede ser que no sea necesario un tratamiento mdico. Si lo necesita, el mdico primero podr recomendarle un tratamiento con uno o ms medicamentos. Si no se reduce el sangrado lo  suficiente, el tratamiento quirrgico podra ser [employer]. El mejor tratamiento para usted depender de:  Si necesita evitar un embarazo.  Si desea tener hijos en el futuro.  La causa y la gravedad del sangrado.  Su opinin o preferencia personal. Algunos medicamentos para la menorragia son:  Mtodos anticonceptivos que contengan hormonas. Estos incluyen la pldora anticonceptiva, el parche en la piel, el anillo vaginal, las inyecciones que se aplican cada 3 meses, el DIU hormonal y el implante. Estos tratamientos reducen el sangrado durante el perodo menstrual.  Medicamentos que espesan la sangre y hacen ms lento el sangrado.  Medicamentos que reducen la inflamacin, como el ibuprofeno.  Medicamentos que contienen una hormona sinttica llamada progestina.  Medicamentos que [employer] ovarios dejen de funcionar durante un breve lapso. Podra ser necesario un tratamiento quirrgico para la menorragia si los medicamentos no son eficaces. Las opciones de tratamiento incluyen:  Dilatacin y curetaje (D y C). En este procedimiento, el mdico abre (dilata) el cuello del tero y luego raspa o succiona tejido del revestimiento interior del tero para reducir el sangrado menstrual.  Histeroscopa quirrgica. En este procedimiento, se utiliza un pequeo tubo con Hali Marry (histeroscopio) para observar la cavidad uterina y ayudar en la extirpacin quirrgica de un plipo que puede ser la causa de perodos abundantes.  Ablacin del endometrio. Por medio de [employer], el mdico destruye de [place] todo el revestimiento interno del tero (endometrio). Luego de la ablacin del endometrio, la mayora de las mujeres tienen escaso flujo menstrual, o no lo tienen. La ablacin del endometrio reduce la posibilidad de quedar embarazada.  Reseccin del  endometrio. En este procedimiento quirrgico, se [place] un asa de Environmental manager para extirpar el revestimiento interno del  tero. Este procedimiento tambin reduce la posibilidad de [place].  Histerectoma. La remocin [place] del tero y el cuello del tero es un procedimiento permanente que detiene los perodos [employer]. El embarazo no es posible luego de Physicist, medical. Este procedimiento requiere de anestesia y hospitalizacin. [place] solo medicamentos de venta libre o recetados, segn las indicaciones del [place] todos los medicamentos recetados exactamente como se le indic. No cambie ni reemplace los medicamentos sin consultarlo con el mdico.  Tome los comprimidos de hierro recetados, Scientist, water quality segn las indicaciones del mdico. Las hemorragias de larga duracin pueden traer como consecuencia una disminucin en los niveles de hierro. Los comprimidos de hierro ayudan a Camera operator hierro que el organismo pierde luego de un sangrado abundante. El hierro puede causarle estreimiento. Si esto es un problema, aumente el consumo de [place], frutas y [place].  No tome aspirina ni medicamentos que contengan aspirina desde 1 semana antes ni durante el perodo menstrual. La aspirina puede hacer que la hemorragia empeore.  Si necesita cambiar el apsito o el tampn ms de una vez cada 2horas, Nature conservation officer en cama y descanse todo lo posible hasta que la hemorragia se detenga.  Siga una dieta balanceada. Consuma alimentos ricos en hierro. Por ejemplo, vegetales de [employer], carne, hgado, huevos y panes y Actor de grano entero. No trate de perder peso hasta que la hemorragia anormal se detenga y los niveles de hierro en la sangre vuelvan a la normalidad.  SOLICITE ATENCIN MDICA SI:  Empapa un tampn o un apsito cada 1 o 2 horas, y [employer] ocurre cada vez que tiene el perodo.  Necesita usar apsitos y tampones al mismo tiempo porque pierde [employer].  Debe cambiarse el apsito o el tampn durante la noche.  Tiene un perodo que dura ms de 8  das.  Elimina cogulos de ms de 1 pulgada (2,5 cm).  Tiene perodos irregulares que ocurren ms o menos de una vez al mes.  Se siente mareada o se desmaya.  Se siente muy dbil o cansada.  Le falta el aire o siente que el corazn late muy rpido al hacer [place].  Tiene nuseas y vmitos o diarrea mientras toma los medicamentos.  Tiene algn problema que puede estar relacionado con el medicamento que est tomando.  SOLICITE ATENCIN MDICA DE INMEDIATO SI:  Empapa 4 o ms apsitos o tampones en 2 horas.  Tiene sangrado y est embarazada.  ASEGRESE DE QUE:  Comprende estas instrucciones.  Controlar su afeccin.  Recibir ayuda de inmediato si no mejora o si empeora.  Esta informacin no tiene Marine scientist el consejo del mdico. Asegrese de hacerle al mdico cualquier pregunta que tenga. Document Released: 01/21/2005 Document Revised: 08/05/2015 Document Reviewed: 10/02/2012 Elsevier Interactive Patient Education  2017 [employer].

## 2016-12-10 NOTE — Progress Notes (Signed)
Subjective:     Patient ID: Sarah Foley, female   DOB: 1971-03-29, 46 y.o.   MRN: [id number]  HPI Yashika is a 46 year old Hispanic female in for follow up on taking Micronor for bleeding, has still had heavy long periods till last one was only 3 days and not quite as heavy.Is tired,back aches and gets Short of breath with climbing stairs.She says had normal pap 2-3 years ago.Interuptor Willette Pa Nandin with her.   Review of Systems Bleeding heavy til last period Tired Back aches  Short of breath when climbing stairs Reviewed past medical,surgical, social and family history. Reviewed medications and allergies.     Objective:   Physical Exam BP (!) 110/50 (BP Location: Left Arm, Patient Position: Sitting, Cuff Size: Large)   Pulse 92   Ht 5\' 5"  (1.651 m)   Wt 206 lb (93.4 kg)   LMP 11/21/2016 (Approximate)   BMI 34.28 kg/m HGB 8.4, Skin warm and dry. Neck: mid line trachea, normal thyroid, good ROM, no lymphadenopathy noted. Lungs: clear to ausculation bilaterally. Cardiovascular: regular rate and rhythm. Will Get [place] to assess uterus and will stop Micronor and start megace and increase iron to 2 daily.     Assessment:     1. Menorrhagia with regular cycle   2. Anemia, unspecified type       Plan:    Return in 1 week for GYN [place] Then see me the next week to review [place] and assess bleeding status and recheck HGB Meds ordered this encounter  Medications  . megestrol (MEGACE) 40 MG tablet    Sig: Take 3 x 5 days then 2 x 5 days then 1 daily    Dispense:  45 tablet    Refill:  1    Order Specific Question:   Supervising Provider    Answer:   Tania Ade H [[id number]]     Take 2 iron tablets a day and eat iron rich food Review handout on anemia and menorrhagia

## 2016-12-11 ENCOUNTER — Other Ambulatory Visit: Payer: Self-pay | Admitting: *Deleted

## 2016-12-11 ENCOUNTER — Telehealth: Payer: Self-pay | Admitting: Adult Health

## 2016-12-11 DIAGNOSIS — N920 Excessive and frequent menstruation with regular cycle: Secondary | ICD-10-CM

## 2016-12-11 NOTE — Telephone Encounter (Signed)
I called patient because she is having an ultrasound done here in the office on 8/22 but pt is getting [facility] and those Ultrasounds have to be done at the hospital. Can we please schedule her an appointment @ the hospital. Pt states that she would like her appointment in the afternoon after 3:00pm. Needed language line when give patient appointment date and time if Charleston Ropes is not in the office. Pt only speak's Spanish

## 2016-12-15 ENCOUNTER — Ambulatory Visit (HOSPITAL_COMMUNITY): Payer: Self-pay

## 2016-12-15 ENCOUNTER — Ambulatory Visit (HOSPITAL_COMMUNITY)
Admission: RE | Admit: 2016-12-15 | Discharge: 2016-12-15 | Disposition: A | Discharge status: Home / Self Care | Payer: Self-pay | Source: Ambulatory Visit | Attending: Adult Health | Admitting: Adult Health

## 2016-12-15 DIAGNOSIS — N852 Hypertrophy of uterus: Secondary | ICD-10-CM | POA: Insufficient documentation

## 2016-12-15 DIAGNOSIS — N920 Excessive and frequent menstruation with regular cycle: Secondary | ICD-10-CM | POA: Insufficient documentation

## 2016-12-15 DIAGNOSIS — N83202 Unspecified ovarian cyst, left side: Secondary | ICD-10-CM | POA: Insufficient documentation

## 2016-12-16 ENCOUNTER — Other Ambulatory Visit: Payer: Self-pay

## 2016-12-21 ENCOUNTER — Ambulatory Visit: Payer: Self-pay | Admitting: Adult Health

## 2016-12-22 ENCOUNTER — Encounter: Payer: Self-pay | Admitting: Adult Health

## 2016-12-22 ENCOUNTER — Ambulatory Visit (INDEPENDENT_AMBULATORY_CARE_PROVIDER_SITE_OTHER): Payer: Self-pay | Admitting: Adult Health

## 2016-12-22 VITALS — BP 100/60 | HR 76 | Ht 63.0 in | Wt 204.0 lb

## 2016-12-22 DIAGNOSIS — N83202 Unspecified ovarian cyst, left side: Secondary | ICD-10-CM

## 2016-12-22 DIAGNOSIS — Z862 Personal history of diseases of the blood and blood-forming organs and certain disorders involving the immune mechanism: Secondary | ICD-10-CM

## 2016-12-22 DIAGNOSIS — N920 Excessive and frequent menstruation with regular cycle: Secondary | ICD-10-CM

## 2016-12-22 LAB — POCT HEMOGLOBIN: Hemoglobin: 11.6 g/dL — AB (ref 12.2–16.2)

## 2016-12-22 MED ORDER — MEGESTROL ACETATE 40 MG PO TABS: ORAL_TABLET | ORAL | 3 refills | Status: DC

## 2016-12-22 NOTE — Addendum Note (Signed)
Addended by: Diona Fanti A on: 12/22/2016 11:45 AM   Modules accepted: Orders

## 2016-12-22 NOTE — Progress Notes (Addendum)
Subjective:     Patient ID: Sarah Foley, female   DOB: 1971/02/01, 46 y.o.   MRN: [id number]  HPI Sarah Foley is a 46 year old Hispanic female back in follow up of taking megace and getting [place], for bleeding.She says she feels better and bleeding has stopped.   Review of Systems Feels much better Bleeding has stopped Occasional pain LLQ  Reviewed past medical,surgical, social and family history. Reviewed medications and allergies.     Objective:   Physical Exam BP 100/60 (BP Location: Left Arm, Patient Position: Sitting, Cuff Size: Large)   Pulse 76   Ht 5\' 3"  (1.6 m)   Wt 204 lb (92.5 kg)   LMP 10/26/2016   BMI 36.14 kg/m HGb 11.6. Talk only. Pt aware US showed left ovarian cyst 4.1 cm and slightly enlarged uterus, no masses. Will continue megace 1 daily and F/U in 3 months.     Assessment:     1. Menorrhagia with regular cycle   2. Left ovarian cyst   3.       History of anemia    Plan:     Meds ordered this encounter  Medications  . megestrol (MEGACE) 40 MG tablet    Sig: Take 1 daily    Dispense:  30 tablet    Refill:  3    Order Specific Question:   Supervising Provider    Answer:   Tania Ade H [[id number]]  Continue Iron Follow up in 3 months, will get F/U US to assess left ovarian cyst

## 2017-02-08 ENCOUNTER — Encounter: Payer: Self-pay | Admitting: Adult Health

## 2017-02-08 ENCOUNTER — Ambulatory Visit (INDEPENDENT_AMBULATORY_CARE_PROVIDER_SITE_OTHER): Payer: Self-pay | Admitting: Adult Health

## 2017-02-08 VITALS — BP 110/52 | HR 88 | Resp 16 | Ht 63.0 in | Wt 204.0 lb

## 2017-02-08 DIAGNOSIS — N920 Excessive and frequent menstruation with regular cycle: Secondary | ICD-10-CM

## 2017-02-08 DIAGNOSIS — D649 Anemia, unspecified: Secondary | ICD-10-CM

## 2017-02-08 LAB — POCT HEMOGLOBIN: HEMOGLOBIN: 8.3 g/dL — AB (ref 12.2–16.2)

## 2017-02-08 MED ORDER — MEGESTROL ACETATE 40 MG PO TABS: ORAL_TABLET | ORAL | 3 refills | Status: DC

## 2017-02-08 NOTE — Patient Instructions (Addendum)
Ablacin del endometrio (Endometrial Ablation) En la ablacin del endometrio se extirpa el recubrimiento interno del tero (endometrio). Este es generalmente un tratamiento que se realiza en el da, en forma ambulatoria. La ablacin permite evitar una ciruga mayor, como la ciruga para extirpar el cuello del tero y el tero (histerectoma). Luego de la ablacin del endometrio, tendr poco o nada de flujo menstrual y no podr tener hijos. Sin embargo, si se encuentra en la etapa de la premenopausia, deber usar un mtodo confiable de control de la natalidad luego del procedimiento, ya que hay una pequea probabilidad de que ocurra un embarazo. Hay diferentes razones para llevar a cabo este procedimiento, ellas son:  Perodos abundantes.  Sangrado que causa anemia.  Sangrado irregular.  Fibromas que sangran en la superficie interior del tero, si no miden ms de 3 cm. Tal vez no sea posible realizarle este procedimiento si:  Desea tener hijos en el futuro.  Tiene clicos intensos durante el perodo menstrual.  Tiene clulas precancerosas o cancerosas en el tero.  Tuvo un embarazo reciente.  Est cursando la menopausia.  Se someti a una ciruga mayor de tero, la cual le produjo el afinamiento de la pared uterina. Las cirugas pueden incluir lo siguiente: ? La extirpacin de uno o ms fibromas uterinos (miomectoma). ? Una cesrea con una incisin clsica (vertical) en el tero. Pregntele al mdico qu tipo de cesrea le realizaron. En ocasiones, la cicatriz en la piel es diferente de la que se forma en el tero. Aunque se haya sometido a una ciruga uterina, algunos tipos de ablacin an pueden ser seguros para su caso. Hable con el mdico. INFORME A SU MDICO:  Cualquier alergia que tenga.  Todos los medicamentos que utiliza, incluyendo vitaminas, hierbas, gotas oftlmicas, cremas y medicamentos de venta libre.  Problemas previos que usted o los miembros de su familia hayan  tenido con el uso de anestsicos.  Enfermedades de la sangre.  Cirugas previas.  Padecimientos mdicos.  RIESGOS Y COMPLICACIONES Generalmente, este es un procedimiento seguro. Sin embargo, como en cualquier procedimiento, pueden surgir complicaciones. Las complicaciones posibles son:  Perforacin del tero.  Hemorragias.  Infeccin en el tero, vejiga, o vagina.  Lesin en los rganos circundantes.  Una burbuja de aire en un pulmn (embolia de aire).  Embarazo luego del procedimiento.  Si el procedimiento no resuelve el problema, ser necesaria una histerectoma.  Disminucin de la capacidad para diagnosticar cncer en el recubrimiento interno del tero. ANTES DEL PROCEDIMIENTO  Deber controlarse la membrana que recubre el tero para asegurarse de que no hay clulas cancerosas o precancerosas.  Le harn una ecografa para observar el tamao del tero y verificar si hay anormalidades.  Le darn medicamentos para disminuir el espesor de la membrana que recubre el tero.  PROCEDIMIENTO Durante el procedimiento, el medico usar un instrumento llamado resectoscopio para ver el interior del tero. Hay diferentes formas de extirpar la membrana que cubre el tero.  Radiofrecuencia: en este mtodo se utiliza un aparato de radiofrecuencia, corriente elctrica alterna, para extirpar la membrana interna del tero.  Crioterapia: en este mtodo se utiliza fro extremo para congelar la membrana del tero.  Lquido caliente: en este mtodo se utiliza solucin salina con el mismo objetivo.  Microondas: Se utilizan microondas de alta energa para calentar la membrana y extirparla.  Baln termal: consiste en insertar un catter con un baln en la punta dentro del tero. La punta del baln contiene un lquido caliente que elimina la membrana que tapiza   el tero. DESPUS DEL PROCEDIMIENTO Despus del procedimiento, no tenga relaciones sexuales ni inserte nada en la vagina hasta que su  mdico la autorice. Despus del procedimiento podr experimentar:  Clicos.  Flujo vaginal.  Ganas de orinar con frecuencia. Esta informacin no tiene Marine scientist el consejo del mdico. Asegrese de hacerle al mdico cualquier pregunta que tenga. Document Released: 12/14/2012 Document Revised: 01/02/2015 Document Reviewed: 09/14/2012 Elsevier Interactive Patient Education  2017 [employer]. F/U in 1 week

## 2017-02-08 NOTE — Addendum Note (Signed)
Addended by: Christiana Pellant A on: 02/08/2017 04:10 PM   Modules accepted: Orders

## 2017-02-08 NOTE — Progress Notes (Signed)
Subjective:     Patient ID: Sarah Foley, female   DOB: 11-02-1970, 46 y.o.   MRN: [id number]  HPI Sarah Foley is a 46 year old Hispanic female in complaining of heavy bleeding again and clots and headache, and dizzy at times.  Review of Systems +heavy bleeding again +clots +headache and dizzy at times Reviewed past medical,surgical, social and family history. Reviewed medications and allergies.     Objective:   Physical Exam BP (!) 110/52 (BP Location: Left Arm, Patient Position: Sitting, Cuff Size: Normal)   Pulse 88   Resp 16   Ht 5\' 3"  (1.6 m)   Wt 204 lb (92.5 kg)   SpO2 99%   BMI 36.14 kg/m HBG 8.3, pt could not void. Skin warm and dry.Pelvic: external genitalia is normal in appearance no lesions, vagina:+period blood and some clots,urethra has no lesions or masses noted, cervix:smooth and bulbous, uterus: normal size, shape and contour, mildly tender, no masses felt, adnexa: no masses or tenderness noted. Bladder is non tender and no masses felt.    Assessment:     1. Menorrhagia with regular cycle   2. Anemia, unspecified type       Plan:     Meds ordered this encounter  Medications  . megestrol (MEGACE) 40 MG tablet    Sig: Take 3 daily for 5 days then 2 daily    Dispense:  75 tablet    Refill:  3    Order Specific Question:   Supervising Provider    Answer:   Tania Ade H [[id number]]  Take 2 iron pills daily Follow up in 1 week Discussed ablation as next step if bleeding does not respond to megace

## 2017-02-11 ENCOUNTER — Other Ambulatory Visit: Payer: Self-pay | Admitting: Adult Health

## 2017-02-15 ENCOUNTER — Encounter (INDEPENDENT_AMBULATORY_CARE_PROVIDER_SITE_OTHER): Payer: Self-pay

## 2017-02-15 ENCOUNTER — Encounter: Payer: Self-pay | Admitting: Adult Health

## 2017-02-15 ENCOUNTER — Ambulatory Visit (INDEPENDENT_AMBULATORY_CARE_PROVIDER_SITE_OTHER): Payer: Self-pay | Admitting: Adult Health

## 2017-02-15 VITALS — BP 100/60 | HR 70 | Ht 63.0 in | Wt 206.0 lb

## 2017-02-15 DIAGNOSIS — D649 Anemia, unspecified: Secondary | ICD-10-CM

## 2017-02-15 DIAGNOSIS — D500 Iron deficiency anemia secondary to blood loss (chronic): Secondary | ICD-10-CM

## 2017-02-15 DIAGNOSIS — N920 Excessive and frequent menstruation with regular cycle: Secondary | ICD-10-CM

## 2017-02-15 LAB — POCT HEMOGLOBIN: Hemoglobin: 9.1 g/dL — AB (ref 12.2–16.2)

## 2017-02-15 NOTE — Progress Notes (Signed)
Subjective:     Patient ID: Sarah Foley, female   DOB: 1970-06-17, 46 y.o.   MRN: [id number]  HPI Sarah Foley is a 46 year old Hispanic female, back in follow up of heavy bleeding.  Review of Systems +bleeding with clots Reviewed past medical,surgical, social and family history. Reviewed medications and allergies.     Objective:   Physical Exam BP 100/60 (BP Location: Left Arm, Patient Position: Sitting, Cuff Size: Large)   Pulse 70   Ht 5\' 3"  (1.6 m)   Wt 206 lb (93.4 kg)   BMI 36.49 kg/m   Talk only.Sarah Foley was my interrupter.  HGB 9.1, which is better, she is still bleeding but not as heavy and has clots. Will continue megace and get back for pre op for ablation.     Assessment:     1. Menorrhagia with regular cycle   2. Anemia, unspecified type       Plan:    continue to take 2 megace daily and 2 iron tabs daily Return in 1 week for pre op with Dr Glo Herring  Review handout on ablation

## 2017-02-15 NOTE — Patient Instructions (Signed)
Ablacin del endometrio (Endometrial Ablation) En la ablacin del endometrio se extirpa el recubrimiento interno del tero (endometrio). Este es generalmente un tratamiento que se realiza en el da, en forma ambulatoria. La ablacin permite evitar una ciruga mayor, como la ciruga para extirpar el cuello del tero y el tero (histerectoma). Luego de la ablacin del endometrio, tendr poco o nada de flujo menstrual y no podr tener hijos. Sin embargo, si se encuentra en la etapa de la premenopausia, deber usar un mtodo confiable de control de la natalidad luego del procedimiento, ya que hay una pequea probabilidad de que ocurra un embarazo. Hay diferentes razones para llevar a cabo este procedimiento, ellas son:  Perodos abundantes.  Sangrado que causa anemia.  Sangrado irregular.  Fibromas que sangran en la superficie interior del tero, si no miden ms de 3 cm. Tal vez no sea posible realizarle este procedimiento si:  Desea tener hijos en el futuro.  Tiene clicos intensos durante el perodo menstrual.  Tiene clulas precancerosas o cancerosas en el tero.  Tuvo un embarazo reciente.  Est cursando la menopausia.  Se someti a una ciruga mayor de tero, la cual le produjo el afinamiento de la pared uterina. Las cirugas pueden incluir lo siguiente: ? La extirpacin de uno o ms fibromas uterinos (miomectoma). ? Una cesrea con una incisin clsica (vertical) en el tero. Pregntele al mdico qu tipo de cesrea le realizaron. En ocasiones, la cicatriz en la piel es diferente de la que se forma en el tero. Aunque se haya sometido a una ciruga uterina, algunos tipos de ablacin an pueden ser seguros para su caso. Hable con el mdico. INFORME A SU MDICO:  Cualquier alergia que tenga.  Todos los medicamentos que utiliza, incluyendo vitaminas, hierbas, gotas oftlmicas, cremas y medicamentos de venta libre.  Problemas previos que usted o los miembros de su familia hayan  tenido con el uso de anestsicos.  Enfermedades de la sangre.  Cirugas previas.  Padecimientos mdicos.  RIESGOS Y COMPLICACIONES Generalmente, este es un procedimiento seguro. Sin embargo, como en cualquier procedimiento, pueden surgir complicaciones. Las complicaciones posibles son:  Perforacin del tero.  Hemorragias.  Infeccin en el tero, vejiga, o vagina.  Lesin en los rganos circundantes.  Una burbuja de aire en un pulmn (embolia de aire).  Embarazo luego del procedimiento.  Si el procedimiento no resuelve el problema, ser necesaria una histerectoma.  Disminucin de la capacidad para diagnosticar cncer en el recubrimiento interno del tero. ANTES DEL PROCEDIMIENTO  Deber controlarse la membrana que recubre el tero para asegurarse de que no hay clulas cancerosas o precancerosas.  Le harn una ecografa para observar el tamao del tero y verificar si hay anormalidades.  Le darn medicamentos para disminuir el espesor de la membrana que recubre el tero.  PROCEDIMIENTO Durante el procedimiento, el medico usar un instrumento llamado resectoscopio para ver el interior del tero. Hay diferentes formas de extirpar la membrana que cubre el tero.  Radiofrecuencia: en este mtodo se utiliza un aparato de radiofrecuencia, corriente elctrica alterna, para extirpar la membrana interna del tero.  Crioterapia: en este mtodo se utiliza fro extremo para congelar la membrana del tero.  Lquido caliente: en este mtodo se utiliza solucin salina con el mismo objetivo.  Microondas: Se utilizan microondas de alta energa para calentar la membrana y extirparla.  Baln termal: consiste en insertar un catter con un baln en la punta dentro del tero. La punta del baln contiene un lquido caliente que elimina la membrana que tapiza   el tero. DESPUS DEL PROCEDIMIENTO Despus del procedimiento, no tenga relaciones sexuales ni inserte nada en la vagina hasta que su  mdico la autorice. Despus del procedimiento podr experimentar:  Clicos.  Flujo vaginal.  Ganas de orinar con frecuencia. Esta informacin no tiene Marine scientist el consejo del mdico. Asegrese de hacerle al mdico cualquier pregunta que tenga. Document Released: 12/14/2012 Document Revised: 01/02/2015 Document Reviewed: 09/14/2012 Elsevier Interactive Patient Education  2017 [employer].

## 2017-02-25 ENCOUNTER — Ambulatory Visit (INDEPENDENT_AMBULATORY_CARE_PROVIDER_SITE_OTHER): Payer: Self-pay | Admitting: Obstetrics and Gynecology

## 2017-02-25 VITALS — BP 110/60 | HR 74 | Ht 63.0 in | Wt 204.2 lb

## 2017-02-25 DIAGNOSIS — Z01818 Encounter for other preprocedural examination: Secondary | ICD-10-CM

## 2017-02-25 DIAGNOSIS — N920 Excessive and frequent menstruation with regular cycle: Secondary | ICD-10-CM

## 2017-02-25 DIAGNOSIS — D500 Iron deficiency anemia secondary to blood loss (chronic): Secondary | ICD-10-CM

## 2017-02-25 NOTE — Progress Notes (Signed)
Preoperative History and Physical  Sarah Foley is a 46 y.o. [id number] here for surgical management of menorrhagia with regular cycle, anemia (unpecified).   No significant preoperative concerns.  Proposed surgery: endometrial ablation   Past Medical History:  Diagnosis Date  . Anemia   . Cataract    Leye  . Depression   . No pertinent past medical history    Past Surgical History:  Procedure Laterality Date  . CATARACT EXTRACTION, BILATERAL Bilateral 2010  . TUBAL LIGATION  06/2010   neg HCG prior to tubal +hcg 1 week after   OB History  Gravida Para Term Preterm AB Living  5 5 5     5   SAB TAB Ectopic Multiple Live Births          5    # Outcome Date GA Lbr Len/2nd Weight Sex Delivery Anes PTL Lv  5 Term 03/17/11 [redacted]w[redacted]d 04:00 / 00:11 8 lb 13.3 oz (4.006 kg) M Vag-Spont None  LIV     Birth Comments: facial bruising  4 Term 2008    F Vag-Spont   LIV  3 Term     M Vag-Spont   LIV  2 Term     M Vag-Spont   LIV  1 Term     F Vag-Spont   LIV    Patient denies any other pertinent gynecologic issues.   Current Outpatient Prescriptions on File Prior to Visit  Medication Sig Dispense Refill  . IRON PO Take 2 tablets by mouth daily.    . megestrol (MEGACE) 40 MG tablet Take 3 daily for 5 days then 2 daily 75 tablet 3   No current facility-administered medications on file prior to visit.    No Known Allergies  Social History:   reports that she has quit smoking. Her smoking use included Cigarettes. She quit after 1.00 year of use. She has never used smokeless tobacco. She reports that she does not drink alcohol or use drugs.  Family History  Problem Relation Age of Onset  . Hypertension Sister   . Mental retardation Daughter        chromosome number 12 is incomplete  . Anesthesia problems Neg Hx   . Hypotension Neg Hx   . Malignant hyperthermia Neg Hx   . Pseudochol deficiency Neg Hx     Review of Systems: Noncontributory  PHYSICAL EXAM: Blood pressure  110/60, pulse 74, height 5\' 3"  (1.6 m), weight 204 lb 3.2 oz (92.6 kg). General appearance - alert, well appearing, and in no distress Chest - clear to auscultation, no wheezes, rales or rhonchi, symmetric air entry Heart - normal rate and regular rhythm Abdomen - soft, nontender, nondistended, no masses or organomegaly  Pelvic VULVA: normal appearing vulva with no masses, tenderness or lesions,  VAGINA: normal appearing vagina with normal color and discharge, no lesions,  CERVIX: normal appearing cervix without discharge or lesions,  UTERUS: uterus is 8 weeks size, normal shape, consistency, anteflexed, slightly enlarged, tender to touch,  ADNEXA: normal adnexa in size, nontender and no masses  Extremities - peripheral pulses normal, no pedal edema, no clubbing or cyanosis  Labs: Results for orders placed or performed in visit on 02/15/17 (from the past 336 hour(s))  POCT hemoglobin   Collection Time: 02/15/17 10:44 AM  Result Value Ref Range   Hemoglobin 9.1 (A) 12.2 - 16.2 g/dL    Imaging Studies: No results found.  Assessment: Patient Active Problem List   Diagnosis Date Noted  . Left ovarian cyst  12/22/2016  . Depression 04/02/2016  . Class 2 obesity with body mass index (BMI) of 35.0 to 35.9 in adult 04/02/2016  . IUD check up 10/25/2014  . Menorrhagia with regular cycle 06/28/2014  . Iron deficiency anemia 06/28/2014   GC/Chl taken U/S suggestive of adenomyosis, by my review Pt requires a spanish translater Will return in 1-2 weeks for endometrial biopsy with Spanish translater  Plan: F/u in 1-2 weeks for endometrial biopsy, with translator Patient will be contidered for surgical management with endometrial ablation., but will discuss hysterectomy also.    .mec 02/25/2017 9:03 AM     By signing my name below, I, Izna Ahmed, attest that this documentation has been prepared under the direction and in the presence of [name], MD. Electronically Signed:  Jabier Gauss, Medical Scribe. 02/25/17. 9:03 AM.  I personally performed the services described in this documentation, which was SCRIBED in my presence. The recorded information has been reviewed and considered accurate. It has been edited as necessary during review. [name], MD

## 2017-02-27 LAB — GC/CHLAMYDIA PROBE AMP
Chlamydia trachomatis, NAA: NEGATIVE
Neisseria gonorrhoeae by PCR: NEGATIVE

## 2017-03-10 ENCOUNTER — Other Ambulatory Visit: Payer: Self-pay | Admitting: Obstetrics and Gynecology

## 2017-03-10 ENCOUNTER — Encounter: Payer: Self-pay | Admitting: Obstetrics and Gynecology

## 2017-03-10 ENCOUNTER — Ambulatory Visit (INDEPENDENT_AMBULATORY_CARE_PROVIDER_SITE_OTHER): Payer: Self-pay | Admitting: Obstetrics and Gynecology

## 2017-03-10 VITALS — BP 100/60 | HR 95 | Wt 207.2 lb

## 2017-03-10 DIAGNOSIS — N920 Excessive and frequent menstruation with regular cycle: Secondary | ICD-10-CM

## 2017-03-10 NOTE — Addendum Note (Signed)
Addended by: Diona Fanti A on: 03/10/2017 09:39 AM   Modules accepted: Orders

## 2017-03-10 NOTE — Progress Notes (Signed)
Endometrial Biopsy: Patient given informed consent, signed copy in the chart, time out was performed. Time out taken. The patient was placed in the lithotomy position and the cervix brought into view with sterile speculum.  Portio of cervix cleansed x 2 with betadine swabs.  A tenaculum was placed in the anterior lip of the cervix. The uterus was sounded for depth of 8 cm,. Milex uterine Explora 3 mm was introduced to into the uterus, suction created,  and an endometrial sample was obtained. All equipment was removed and accounted for.   The patient tolerated the procedure well.    Patient given post procedure instructions.  Followup: 2 WK FOR DISCUSSION OF TX OPTIONS WITH TRANSLATOR TO BE HERE.

## 2017-03-16 NOTE — Progress Notes (Signed)
No colposcopy used on this evaluation. Thank you for the query

## 2017-03-24 ENCOUNTER — Other Ambulatory Visit: Payer: Self-pay

## 2017-03-24 ENCOUNTER — Ambulatory Visit (INDEPENDENT_AMBULATORY_CARE_PROVIDER_SITE_OTHER): Payer: Self-pay | Admitting: Obstetrics and Gynecology

## 2017-03-24 ENCOUNTER — Encounter: Payer: Self-pay | Admitting: Obstetrics and Gynecology

## 2017-03-24 VITALS — BP 114/60 | HR 92 | Ht 63.0 in | Wt 208.5 lb

## 2017-03-24 DIAGNOSIS — N920 Excessive and frequent menstruation with regular cycle: Secondary | ICD-10-CM

## 2017-03-24 DIAGNOSIS — D508 Other iron deficiency anemias: Secondary | ICD-10-CM

## 2017-03-24 DIAGNOSIS — D500 Iron deficiency anemia secondary to blood loss (chronic): Secondary | ICD-10-CM

## 2017-03-24 NOTE — Progress Notes (Addendum)
Patient ID: Sarah Foley, female   DOB: 10-27-1970, 46 y.o.   MRN: [id number]   [facility] Visit  @DATE @            Patient name: Sarah Foley MRN [id number]  Date of birth: 02-01-1971  CC & HPI:  Sarah Foley is a 46 y.o. female presenting today for a follow-up and discussion after her endometrial biopsy that was done on 03/10/2017. The patient's initial complaint was menorrhagia and she was seen on 08/05/2016.  He has been anemic, has had an IUD since then she has tried Associate Professor and Megace with temporary relief. At this time, she is not taking either. She is accompanied by an interpreter. The patient is here to discuss her next options, which include IUD insertion, endometrial ablation, and hysterectomy, if needed. The patient denies any other symptoms or complaints at this time.  ROS:  ROS +menorrhagia All systems are negative except as noted in the HPI and PMH.  Endometrium, biopsy - SMALL FRAGMENTS OF BENIGN ENDOMETRIAL TISSUE WITH HEMORRHAGE. SEE COMMENT. - NO HYPERPLASIA OR MALIGNANCY IDENTIFIED. Microscopic Comment COMMENT: The microglandular nature of the tissue is suggestive of hormone effect. (MJ:gt, 03/12/17) Sarah Martinique MD Pathologist, Electronic Signature (Case signed 03/12/2017) Specimen Gross and Clinical Information Specimen Comment On Megace for menorrhagia; after biopsy - place IUD or endometrial ablation   Pertinent History Reviewed:   Reviewed: Significant for menorrhagia, anemia and tubal ligation Medical         Past Medical History:  Diagnosis Date  . Anemia   . Cataract    Leye  . Depression   . No pertinent past medical history          Ultrasound data from August   CLINICAL DATA:  Menorrhagia.  Anemia.  EXAM: TRANSABDOMINAL AND TRANSVAGINAL ULTRASOUND OF PELVIS  TECHNIQUE: Both transabdominal and transvaginal ultrasound examinations of the pelvis were performed. Transabdominal technique was performed  for global imaging of the pelvis including uterus, ovaries, adnexal regions, and pelvic cul-de-sac. It was necessary to proceed with endovaginal exam following the transabdominal exam to visualize the endometrium and ovaries.  COMPARISON:  None  FINDINGS: Uterus  Measurements: 13.9 x 6.2 x 9.0 cm. The uterus is heterogeneous with no focal mass.  Endometrium  Thickness: 5.8 mm.  No focal abnormality visualized.  Right ovary  Not visualized.  Left ovary  Measurements: 6.5 x 4.2 x 5.3 cm. Contains a dominant cyst/follicle measuring 4.1 x 3.2 x 3.9 cm.  Other findings  No abnormal free fluid.  IMPRESSION: 1. Uterus is mildly enlarged. No focal mass. The endometrium is normal. 2. There is a dominant cyst/follicle in the left ovary measuring up to 4.1 cm.   Electronically Signed   By: Dorise Bullion III M.D   On: 12/15/2016 20:21                     Surgical Hx:    Past Surgical History:  Procedure Laterality Date  . CATARACT EXTRACTION, BILATERAL Bilateral 2010  . TUBAL LIGATION  06/2010   neg HCG prior to tubal +hcg 1 week after   Medications: Reviewed & Updated - see associated section                       Current Outpatient Medications:  .  IRON PO, Take 2 tablets by mouth daily., Disp: , Rfl:  .  megestrol (MEGACE) 40 MG tablet, Take 3 daily for 5 days then 2 daily,  Disp: 75 tablet, Rfl: 3   Social History: Reviewed -  reports that she has quit smoking. Her smoking use included cigarettes. She quit after 1.00 year of use. she has never used smokeless tobacco.  Objective Findings:  Vitals: Blood pressure 114/60, pulse 92, height 5\' 3"  (1.6 m), weight 208 lb 8 oz (94.6 kg).  Physical Examination: General appearance - alert, well appearing, and in no distress and oriented to person, place, and time Mental status - alert, oriented to person, place, and time, normal mood, behavior, speech, dress, motor activity, and thought processes, affect  appropriate to mood Pelvic: Vagina - Good support  Cervix - anterior, normal Uterus - slightly enlarged, 8 weeks size on bimanual exam.  It does not feel as large as described below on ultrasound, and was only sounded to 8 cm at the time of endometrial biopsy Adnexa - negative  Assessment & Plan:   A:  1. Menorrhagia 2 moderate uterune enlargement, 411 gm by u/s calculations.  2. Anemia due to menorrhagia  P:  1. Rx Megace 2. Will contact for hysteroscopy, D&C and endometrial ablation.   By signing my name below, I, Margit Banda, attest that this documentation has been prepared under the direction and in the presence of [name], MD. Electronically Signed: Margit Banda, Medical Scribe. 03/24/17. 10:09 AM.  I personally performed the services described in this documentation, which was SCRIBED in my presence. The recorded information has been reviewed and considered accurate. It has been edited as necessary during review. [name], MD

## 2017-04-06 NOTE — Patient Instructions (Addendum)
Sarah Foley  04/06/2017     @PREFPERIOPPHARMACY @   Your procedure is scheduled on  04/13/2017.  Report to [facility] at  615   A.M.  Call this number if you have problems the morning of surgery:  [phone]   Remember:  Do not eat food or drink liquids after midnight.  Take these medicines the morning of surgery with A SIP OF WATER  None   Do not wear jewelry, make-up or nail polish.  Do not wear lotions, powders, or perfumes, or deodorant.  Do not shave 48 hours prior to surgery.  Men may shave face and neck.  Do not bring valuables to the hospital.  [facility] is not responsible for any belongings or valuables.  Contacts, dentures or bridgework may not be worn into surgery.  Leave your suitcase in the car.  After surgery it may be brought to your room.  For patients admitted to the hospital, discharge time will be determined by your treatment team.  Patients discharged the day of surgery will not be allowed to drive home.   Name and phone number of your driver:   family Special instructions:  None  Please read over the following fact sheets that you were given. Anesthesia Post-op Instructions and Care and Recovery After Surgery      Hysteroscopy Hysteroscopy is a procedure used for looking inside the womb (uterus). It may be done for various reasons, including:  To evaluate abnormal bleeding, fibroid (benign, noncancerous) tumors, polyps, scar tissue (adhesions), and possibly cancer of the uterus.  To look for lumps (tumors) and other uterine growths.  To look for causes of why a woman cannot get pregnant (infertility), causes of recurrent loss of pregnancy (miscarriages), or a lost intrauterine device (IUD).  To perform a sterilization by blocking the fallopian tubes from inside the uterus.  In this procedure, a thin, flexible tube with a tiny light and camera on the end of it (hysteroscope) is used to look inside the  uterus. A hysteroscopy should be done right after a menstrual period to be sure you are not pregnant. LET [facility] CARE PROVIDER KNOW ABOUT:  Any allergies you have.  All medicines you are taking, including vitamins, herbs, eye drops, creams, and over-the-counter medicines.  Previous problems you or members of your family have had with the use of anesthetics.  Any blood disorders you have.  Previous surgeries you have had.  Medical conditions you have. RISKS AND COMPLICATIONS Generally, this is a safe procedure. However, as with any procedure, complications can occur. Possible complications include:  Putting a hole in the uterus.  Excessive bleeding.  Infection.  Damage to the cervix.  Injury to other organs.  Allergic reaction to medicines.  Too much fluid used in the uterus for the procedure.  BEFORE THE PROCEDURE  Ask your health care provider about changing or stopping any regular medicines.  Do not take aspirin or blood thinners for 1 week before the procedure, or as directed by your health care provider. These can cause bleeding.  If you smoke, do not smoke for 2 weeks before the procedure.  In some cases, a medicine is placed in the cervix the day before the procedure. This medicine makes the cervix have a larger opening (dilate). This makes it easier for the instrument to be inserted into the uterus during the procedure.  Do not eat or drink anything for  at least 8 hours before the surgery.  Arrange for someone to take you home after the procedure. PROCEDURE  You may be given a medicine to relax you (sedative). You may also be given one of the following: ? A medicine that numbs the area around the cervix (local anesthetic). ? A medicine that makes you sleep through the procedure (general anesthetic).  The hysteroscope is inserted through the vagina into the uterus. The camera on the hysteroscope sends a picture to a TV screen. This gives the surgeon a good  view inside the uterus.  During the procedure, air or a liquid is put into the uterus, which allows the surgeon to see better.  Sometimes, tissue is gently scraped from inside the uterus. These tissue samples are sent to a lab for testing. What to expect after the procedure  If you had a general anesthetic, you may be groggy for a couple hours after the procedure.  If you had a local anesthetic, you will be able to go home as soon as you are stable and feel ready.  You may have some cramping. This normally lasts for a couple days.  You may have bleeding, which varies from light spotting for a few days to menstrual-like bleeding for 3-7 days. This is normal.  If your test results are not back during the visit, make an appointment with your health care provider to find out the results. This information is not intended to replace advice given to you by your health care provider. Make sure you discuss any questions you have with your health care provider. Document Released: 07/20/2000 Document Revised: 09/19/2015 Document Reviewed: 11/10/2012 Elsevier Interactive Patient Education  2017 [place]. Hysteroscopy, Care After Refer to this sheet in the next few weeks. These instructions provide you with information on caring for yourself after your procedure. Your health care provider may also give you more specific instructions. Your treatment has been planned according to current medical practices, but problems sometimes occur. Call your health care provider if you have any problems or questions after your procedure. What can I expect after the procedure? After your procedure, it is typical to have the following:  You may have some cramping. This normally lasts for a couple days.  You may have bleeding. This can vary from light spotting for a few days to menstrual-like bleeding for 3-7 days.  Follow these instructions at home:  Rest for the first 1-2 days after the procedure.  Only take  over-the-counter or prescription medicines as directed by your health care provider. Do not take aspirin. It can increase the chances of bleeding.  Take showers instead of baths for 2 weeks or as directed by your health care provider.  Do not drive for 24 hours or as directed.  Do not drink alcohol while taking pain medicine.  Do not use tampons, douche, or have sexual intercourse for 2 weeks or until your health care provider says it is okay.  Take your temperature twice a day for 4-5 days. Write it down each time.  Follow your health care provider's advice about diet, exercise, and lifting.  If you develop constipation, you may: ? Take a mild laxative if your health care provider approves. ? Add bran foods to your diet. ? Drink enough fluids to keep your urine clear or pale yellow.  Try to have someone with you or available to you for the first 24-48 hours, especially if you were given a general anesthetic.  Follow up with your  health care provider as directed. Contact a health care provider if:  You feel dizzy or lightheaded.  You feel sick to your stomach (nauseous).  You have abnormal vaginal discharge.  You have a rash.  You have pain that is not controlled with medicine. Get help right away if:  You have bleeding that is heavier than a normal menstrual period.  You have a fever.  You have increasing cramps or pain, not controlled with medicine.  You have new belly (abdominal) pain.  You pass out.  You have pain in the tops of your shoulders (shoulder strap areas).  You have shortness of breath. This information is not intended to replace advice given to you by your health care provider. Make sure you discuss any questions you have with your health care provider. Document Released: 02/01/2013 Document Revised: 09/19/2015 Document Reviewed: 11/10/2012 Elsevier Interactive Patient Education  2017 [place].  Dilation and Curettage or Vacuum  Curettage Dilation and curettage (D&C) and vacuum curettage are minor procedures. A D&C involves stretching (dilation) the cervix and scraping (curettage) the inside lining of the uterus (endometrium). During a D&C, tissue is gently scraped from the endometrium, starting from the top portion of the uterus down to the lowest part of the uterus (cervix). During a vacuum curettage, the lining and tissue in the uterus are removed with the use of gentle suction. Curettage may be performed to either diagnose or treat a problem. As a diagnostic procedure, curettage is performed to examine tissues from the uterus. A diagnostic curettage may be done if you have:  Irregular bleeding in the uterus.  Bleeding with the development of clots.  Spotting between menstrual periods.  Prolonged menstrual periods or other abnormal bleeding.  Bleeding after menopause.  No menstrual period (amenorrhea).  A change in size and shape of the uterus.  Abnormal endometrial cells discovered during a Pap test.  As a treatment procedure, curettage may be performed for the following reasons:  Removal of an IUD (intrauterine device).  Removal of retained placenta after giving birth.  Abortion.  Miscarriage.  Removal of endometrial polyps.  Removal of uncommon types of noncancerous lumps (fibroids).  Tell a health care provider about:  Any allergies you have, including allergies to prescribed medicine or latex.  All medicines you are taking, including vitamins, herbs, eye drops, creams, and over-the-counter medicines. This is especially important if you take any blood-thinning medicine. Bring a list of all of your medicines to your appointment.  Any problems you or family members have had with anesthetic medicines.  Any blood disorders you have.  Any surgeries you have had.  Your medical history and any medical conditions you have.  Whether you are pregnant or may be pregnant.  Recent vaginal  infections you have had.  Recent menstrual periods, bleeding problems you have had, and what form of birth control (contraception) you use. What are the risks? Generally, this is a safe procedure. However, problems may occur, including:  Infection.  Heavy vaginal bleeding.  Allergic reactions to medicines.  Damage to the cervix or other structures or organs.  Development of scar tissue (adhesions) inside the uterus, which can cause abnormal amounts of menstrual bleeding. This may make it harder to get pregnant in the future.  A hole (perforation) or puncture in the uterine wall. This is rare.  What happens before the procedure? Staying hydrated Follow instructions from your health care provider about hydration, which may include:  Up to 2 hours before the procedure - you  may continue to drink clear liquids, such as water, clear fruit juice, black coffee, and plain tea.  Eating and drinking restrictions Follow instructions from your health care provider about eating and drinking, which may include:  8 hours before the procedure - stop eating heavy meals or foods such as meat, fried foods, or fatty foods.  6 hours before the procedure - stop eating light meals or foods, such as toast or cereal.  6 hours before the procedure - stop drinking milk or drinks that contain milk.  2 hours before the procedure - stop drinking clear liquids. If your health care provider told you to take your medicine(s) on the day of your procedure, take them with only a sip of water.  Medicines  Ask your health care provider about: ? Changing or stopping your regular medicines. This is especially important if you are taking diabetes medicines or blood thinners. ? Taking medicines such as aspirin and ibuprofen. These medicines can thin your blood. Do not take these medicines before your procedure if your health care provider instructs you not to.  You may be given antibiotic medicine to help prevent  infection. General instructions  For 24 hours before your procedure, do not: ? Douche. ? Use tampons. ? Use medicines, creams, or suppositories in the vagina. ? Have sexual intercourse.  You may be given a pregnancy test on the day of the procedure.  Plan to have someone take you home from the hospital or clinic.  You may have a blood or urine sample taken.  If you will be going home right after the procedure, plan to have someone with you for 24 hours. What happens during the procedure?  To reduce your risk of infection: ? Your health care team will wash or sanitize their hands. ? Your skin will be washed with soap.  An IV tube will be inserted into one of your veins.  You will be given one of the following: ? A medicine that numbs the area in and around the cervix (local anesthetic). ? A medicine to make you fall asleep (general anesthetic).  You will lie down on your back, with your feet in foot rests (stirrups).  The size and position of your uterus will be checked.  A lubricated instrument (speculum or Sims retractor) will be inserted into the back side of your vagina. The speculum will be used to hold apart the walls of your vagina so your health care provider can see your cervix.  A tool (tenaculum) will be attached to the lip of the cervix to stabilize it.  Your cervix will be softened and dilated. This may be done by: ? Taking a medicine. ? Having tapered dilators or thin rods (laminaria) or gradual widening instruments (tapered dilators) inserted into your cervix.  A small, sharp, curved instrument (curette) will be used to scrape a small amount of tissue or cells from the endometrium or cervical canal. In some cases, gentle suction is applied with the curette. The curette will then be removed. The cells will be taken to a lab for testing. The procedure may vary among health care providers and hospitals. What happens after the procedure?  You may have mild  cramping, backache, pain, and light bleeding or spotting. You may pass small blood clots from your vagina.  You may have to wear compression stockings. These stockings help to prevent blood clots and reduce swelling in your legs.  Your blood pressure, heart rate, breathing rate, and blood oxygen level will  be monitored until the medicines you were given have worn off. Summary  Dilation and curettage (D&C) involves stretching (dilation) the cervix and scraping (curettage) the inside lining of the uterus (endometrium).  After the procedure, you may have mild cramping, backache, pain, and light bleeding or spotting. You may pass small blood clots from your vagina.  Plan to have someone take you home from the hospital or clinic. This information is not intended to replace advice given to you by your health care provider. Make sure you discuss any questions you have with your health care provider. Document Released: 04/13/2005 Document Revised: 12/29/2015 Document Reviewed: 12/29/2015 Elsevier Interactive Patient Education  2018 [employer].  Dilation and Curettage or Vacuum Curettage, Care After These instructions give you information about caring for yourself after your procedure. Your doctor may also give you more specific instructions. Call your doctor if you have any problems or questions after your procedure. Follow these instructions at home: Activity  Do not drive or use heavy machinery while taking prescription pain medicine.  For 24 hours after your procedure, avoid driving.  Take short walks often, followed by rest periods. Ask your doctor what activities are safe for you. After one or two days, you may be able to return to your normal activities.  Do not lift anything that is heavier than 10 lb (4.5 kg) until your doctor approves.  For at least 2 weeks, or as long as told by your doctor: ? Do not douche. ? Do not use tampons. ? Do not have sex. General instructions  Take  over-the-counter and prescription medicines only as told by your doctor. This is very important if you take blood thinning medicine.  Do not take baths, swim, or use a hot tub until your doctor approves. Take showers instead of baths.  Wear compression stockings as told by your doctor.  It is up to you to get the results of your procedure. Ask your doctor when your results will be ready.  Keep all follow-up visits as told by your doctor. This is important. Contact a doctor if:  You have very bad cramps that get worse or do not get better with medicine.  You have very bad pain in your belly (abdomen).  You cannot drink fluids without throwing up (vomiting).  You get pain in a different part of the area between your belly and thighs (pelvis).  You have bad-smelling discharge from your vagina.  You have a rash. Get help right away if:  You are bleeding a lot from your vagina. A lot of bleeding means soaking more than one sanitary pad in an hour, for 2 hours in a row.  You have clumps of blood (blood clots) coming from your vagina.  You have a fever or chills.  Your belly feels very tender or hard.  You have chest pain.  You have trouble breathing.  You cough up blood.  You feel dizzy.  You feel light-headed.  You pass out (faint).  You have pain in your neck or shoulder area. Summary  Take short walks often, followed by rest periods. Ask your doctor what activities are safe for you. After one or two days, you may be able to return to your normal activities.  Do not lift anything that is heavier than 10 lb (4.5 kg) until your doctor approves.  Do not take baths, swim, or use a hot tub until your doctor approves. Take showers instead of baths.  Contact your doctor if you have  any symptoms of infection, like bad-smelling discharge from your vagina. This information is not intended to replace advice given to you by your health care provider. Make sure you discuss any  questions you have with your health care provider. Document Released: 01/21/2008 Document Revised: 12/30/2015 Document Reviewed: 12/30/2015 Elsevier Interactive Patient Education  2017 [place].  Endometrial Ablation Endometrial ablation is a procedure that destroys the thin inner layer of the lining of the uterus (endometrium). This procedure may be done:  To stop heavy periods.  To stop bleeding that is causing anemia.  To control irregular bleeding.  To treat bleeding caused by small tumors (fibroids) in the endometrium.  This procedure is often an alternative to major surgery, such as removal of the uterus and cervix (hysterectomy). As a result of this procedure:  You may not be able to have children. However, if you are premenopausal (you have not gone through menopause): ? You may still have a small chance of getting pregnant. ? You will need to use a reliable method of birth control after the procedure to prevent pregnancy.  You may stop having a menstrual period, or you may have only a small amount of bleeding during your period. Menstruation may return several years after the procedure.  Tell a health care provider about:  Any allergies you have.  All medicines you are taking, including vitamins, herbs, eye drops, creams, and over-the-counter medicines.  Any problems you or family members have had with the use of anesthetic medicines.  Any blood disorders you have.  Any surgeries you have had.  Any medical conditions you have. What are the risks? Generally, this is a safe procedure. However, problems may occur, including:  A hole (perforation) in the uterus or bowel.  Infection of the uterus, bladder, or vagina.  Bleeding.  Damage to other structures or organs.  An air bubble in the lung (air embolus).  Problems with pregnancy after the procedure.  Failure of the procedure.  Decreased ability to diagnose cancer in the endometrium.  What happens  before the procedure?  You will have tests of your endometrium to make sure there are no pre-cancerous cells or cancer cells present.  You may have an ultrasound of the uterus.  You may be given medicines to thin the endometrium.  Ask your health care provider about: ? Changing or stopping your regular medicines. This is especially important if you take diabetes medicines or blood thinners. ? Taking medicines such as aspirin and ibuprofen. These medicines can thin your blood. Do not take these medicines before your procedure if your doctor tells you not to.  Plan to have someone take you home from the hospital or clinic. What happens during the procedure?  You will lie on an exam table with your feet and legs supported as in a pelvic exam.  To lower your risk of infection: ? Your health care team will wash or sanitize their hands and put on germ-free (sterile) gloves. ? Your genital area will be washed with soap.  An IV tube will be inserted into one of your veins.  You will be given a medicine to help you relax (sedative).  A surgical instrument with a light and camera (resectoscope) will be inserted into your vagina and moved into your uterus. This allows your surgeon to see inside your uterus.  Endometrial tissue will be removed using one of the following methods: ? Radiofrequency. This method uses a radiofrequency-alternating electric current to remove the endometrium. ? Cryotherapy. This  method uses extreme cold to freeze the endometrium. ? Heated-free liquid. This method uses a heated saltwater (saline) solution to remove the endometrium. ? Microwave. This method uses high-energy microwaves to heat up the endometrium and remove it. ? Thermal balloon. This method involves inserting a catheter with a balloon tip into the uterus. The balloon tip is filled with heated fluid to remove the endometrium. The procedure may vary among health care providers and hospitals. What happens  after the procedure?  Your blood pressure, heart rate, breathing rate, and blood oxygen level will be monitored until the medicines you were given have worn off.  As tissue healing occurs, you may notice vaginal bleeding for 4-6 weeks after the procedure. You may also experience: ? Cramps. ? Thin, watery vaginal discharge that is light pink or brown in color. ? A need to urinate more frequently than usual. ? Nausea.  Do not drive for 24 hours if you were given a sedative.  Do not have sex or insert anything into your vagina until your health care provider approves. Summary  Endometrial ablation is done to treat the many causes of heavy menstrual bleeding.  The procedure may be done only after medications have been tried to control the bleeding.  Plan to have someone take you home from the hospital or clinic. This information is not intended to replace advice given to you by your health care provider. Make sure you discuss any questions you have with your health care provider. Document Released: 02/21/2004 Document Revised: 04/30/2016 Document Reviewed: 04/30/2016 Elsevier Interactive Patient Education  2017 [place] Anesthesia, Adult General anesthesia is the use of medicines to make a person "go to sleep" (be unconscious) for a medical procedure. General anesthesia is often recommended when a procedure:  Is long.  Requires you to be still or in an unusual position.  Is major and can cause you to lose blood.  Is impossible to do without general anesthesia.  The medicines used for general anesthesia are called general anesthetics. In addition to making you sleep, the medicines:  Prevent pain.  Control your blood pressure.  Relax your muscles.  Tell a health care provider about:  Any allergies you have.  All medicines you are taking, including vitamins, herbs, eye drops, creams, and over-the-counter medicines.  Any problems you or family members have had  with anesthetic medicines.  Types of anesthetics you have had in the past.  Any bleeding disorders you have.  Any surgeries you have had.  Any medical conditions you have.  Any history of heart or lung conditions, such as heart failure, sleep apnea, or chronic obstructive pulmonary disease (COPD).  Whether you are pregnant or may be pregnant.  Whether you use tobacco, alcohol, marijuana, or street drugs.  Any history of Armed forces logistics/support/administrative officer.  Any history of depression or anxiety. What are the risks? Generally, this is a safe procedure. However, problems may occur, including:  Allergic reaction to anesthetics.  Lung and heart problems.  Inhaling food or liquids from your stomach into your lungs (aspiration).  Injury to nerves.  Waking up during your procedure and being unable to move (rare).  Extreme agitation or a state of mental confusion (delirium) when you wake up from the anesthetic.  Air in the bloodstream, which can lead to stroke.  These problems are more likely to develop if you are having a major surgery or if you have an advanced medical condition. You can prevent some of these complications by answering all  of your health care provider's questions thoroughly and by following all pre-procedure instructions. General anesthesia can cause side effects, including:  Nausea or vomiting  A sore throat from the breathing tube.  Feeling cold or shivery.  Feeling tired, washed out, or achy.  Sleepiness or drowsiness.  Confusion or agitation.  What happens before the procedure? Staying hydrated Follow instructions from your health care provider about hydration, which may include:  Up to 2 hours before the procedure - you may continue to drink clear liquids, such as water, clear fruit juice, black coffee, and plain tea.  Eating and drinking restrictions Follow instructions from your health care provider about eating and drinking, which may include:  8 hours  before the procedure - stop eating heavy meals or foods such as meat, fried foods, or fatty foods.  6 hours before the procedure - stop eating light meals or foods, such as toast or cereal.  6 hours before the procedure - stop drinking milk or drinks that contain milk.  2 hours before the procedure - stop drinking clear liquids.  Medicines  Ask your health care provider about: ? Changing or stopping your regular medicines. This is especially important if you are taking diabetes medicines or blood thinners. ? Taking medicines such as aspirin and ibuprofen. These medicines can thin your blood. Do not take these medicines before your procedure if your health care provider instructs you not to. ? Taking new dietary supplements or medicines. Do not take these during the week before your procedure unless your health care provider approves them.  If you are told to take a medicine or to continue taking a medicine on the day of the procedure, take the medicine with sips of water. General instructions   Ask if you will be going home the same day, the following day, or after a longer hospital stay. ? Plan to have someone take you home. ? Plan to have someone stay with you for the first 24 hours after you leave the hospital or clinic.  For 3-6 weeks before the procedure, try not to use any tobacco products, such as cigarettes, chewing tobacco, and e-cigarettes.  You may brush your teeth on the morning of the procedure, but make sure to spit out the toothpaste. What happens during the procedure?  You will be given anesthetics through a mask and through an IV tube in one of your veins.  You may receive medicine to help you relax (sedative).  As soon as you are asleep, a breathing tube may be used to help you breathe.  An anesthesia specialist will stay with you throughout the procedure. He or she will help keep you comfortable and safe by continuing to give you medicines and adjusting the amount  of medicine that you get. He or she will also watch your blood pressure, pulse, and oxygen levels to make sure that the anesthetics do not cause any problems.  If a breathing tube was used to help you breathe, it will be removed before you wake up. The procedure may vary among health care providers and hospitals. What happens after the procedure?  You will wake up, often slowly, after the procedure is complete, usually in a recovery area.  Your blood pressure, heart rate, breathing rate, and blood oxygen level will be monitored until the medicines you were given have worn off.  You may be given medicine to help you calm down if you feel anxious or agitated.  If you will be going home the  same day, your health care provider may check to make sure you can stand, drink, and urinate.  Your health care providers will treat your pain and side effects before you go home.  Do not drive for 24 hours if you received a sedative.  You may: ? Feel nauseous and vomit. ? Have a sore throat. ? Have mental slowness. ? Feel cold or shivery. ? Feel sleepy. ? Feel tired. ? Feel sore or achy, even in parts of your body where you did not have surgery. This information is not intended to replace advice given to you by your health care provider. Make sure you discuss any questions you have with your health care provider. Document Released: 07/21/2007 Document Revised: 09/24/2015 Document Reviewed: 03/28/2015 Elsevier Interactive Patient Education  2018 [place] Anesthesia, Adult, Care After These instructions provide you with information about caring for yourself after your procedure. Your health care provider may also give you more specific instructions. Your treatment has been planned according to current medical practices, but problems sometimes occur. Call your health care provider if you have any problems or questions after your procedure. What can I expect after the procedure? After the  procedure, it is common to have:  Vomiting.  A sore throat.  Mental slowness.  It is common to feel:  Nauseous.  Cold or shivery.  Sleepy.  Tired.  Sore or achy, even in parts of your body where you did not have surgery.  Follow these instructions at home: For at least 24 hours after the procedure:  Do not: ? Participate in activities where you could fall or become injured. ? Drive. ? Use heavy machinery. ? Drink alcohol. ? Take sleeping pills or medicines that cause drowsiness. ? Make important decisions or sign legal documents. ? Take care of children on your own.  Rest. Eating and drinking  If you vomit, drink water, juice, or soup when you can drink without vomiting.  Drink enough fluid to keep your urine clear or pale yellow.  Make sure you have little or no nausea before eating solid foods.  Follow the diet recommended by your health care provider. General instructions  Have a responsible adult stay with you until you are awake and alert.  Return to your normal activities as told by your health care provider. Ask your health care provider what activities are safe for you.  Take over-the-counter and prescription medicines only as told by your health care provider.  If you smoke, do not smoke without supervision.  Keep all follow-up visits as told by your health care provider. This is important. Contact a health care provider if:  You continue to have nausea or vomiting at home, and medicines are not helpful.  You cannot drink fluids or start eating again.  You cannot urinate after 8-12 hours.  You develop a skin rash.  You have fever.  You have increasing redness at the site of your procedure. Get help right away if:  You have difficulty breathing.  You have chest pain.  You have unexpected bleeding.  You feel that you are having a life-threatening or urgent problem. This information is not intended to replace advice given to you by your  health care provider. Make sure you discuss any questions you have with your health care provider. Document Released: 07/20/2000 Document Revised: 09/16/2015 Document Reviewed: 03/28/2015 Elsevier Interactive Patient Education  [employer].  Instrucciones Para Antes de la Ciruga   Su ciruga est programada para-(your procedure is scheduled on)  04/13/2017    [facility] - (enter)    Por favor llame al [phone] si tiene algn problema en la maana de la ciruga. (please call if you have any problems the morning of surgery.)                  Recuerde: (Remember)   No coma alimentos ni tome lquidos, incluyendo agua, despus de la medianoche del  (Do not eat food or drink liquids including water after midnight on 04/12/2017.   Tome estas medicinas en la maana de la ciruga con un SORBITO de agua (take these meds the morning of surgery with a SIP of water)  None   Puede cepillarse los dientes en la maana de la [place]. (you may brush your teeth the morning of surgery)   No use joyas, maquillaje de ojos, lpiz labial, crema para el cuerpo o esmalte de uas oscuro. (Do not wear jewelry, eye makeup, lipstick, body lotion, or dark fingernail polish)   No puede usar desodorante. (you may wear deodorant)   Si va a ser ingresado despues de la ciruga, deje la maleta en el carro hasta que se le haya asignado una habitacin. (If you are to be admitted after surgery, leave suitcase in car until your room has been assigned.)   A los pacientes que se les d de alta el mismo da no se les permitir manejar a casa.  (Patients discharged on the day of surgery will not be allowed to drive home)   Use ropa suelta y cmoda de regreso a casa. (wear loose comfortable clothes for ride home)    Firma del paciente (patient signature)   Sarah Foley  04/06/2017     @[email or web address] @   Your  procedure is scheduled on  04/13/2017   Report to [facility] at  615   A.M.  Call this number if you have problems the morning of surgery:  [phone]   Remember:  Do not eat food or drink liquids after midnight.  Take these medicines the morning of surgery with A SIP OF WATER None   Do not wear jewelry, make-up or nail polish.  Do not wear lotions, powders, or perfumes, or deodorant.  Do not shave 48 hours prior to surgery.  Men may shave face and neck.  Do not bring valuables to the hospital.  [facility] is not responsible for any belongings or valuables.  Contacts, dentures or bridgework may not be worn into surgery.  Leave your suitcase in the car.  After surgery it may be brought to your room.  For patients admitted to the hospital, discharge time will be determined by your treatment team.  Patients discharged the day of surgery will not be allowed to drive home.   Name and phone number of your driver:   family Special instructions:  None  Please read over the following fact sheets that you were given. Anesthesia Post-op Instructions and Care and Recovery After Surgery      Histeroscopa (Hysteroscopy) La histeroscopa es un procedimiento que se [place] para observar el interior de la matriz (tero) Puede indicarse por diferentes motivos, entre ellos:  Para evaluar una hemorragia anormal, un fibroma (tumor benigno, no canceroso), tumores, plipos o tejido cicatrizal (adherencias) y la posibilidad de cncer de tero.  Para detectar bultos (tumores) y otros crecimientos anormales uterinos.  Para buscar las causas por las que [place]  mujer no queda embarazada (infertilidad) causas recurrentes de prdida de embarazo (abortos espontneos) o por la prdida de un dispositivo intrauterino (DIU).  Para realizar un procedimiento de esterilizacin cerrando las trompas de [place] desde adentro del tero. En este procedimiento, se coloca un tubo delgado y luminoso, con una cmara en el  extremo (histeroscopio)para observar el interior del tero. La histeroscopa se realiza inmediatamente despus del perodo menstrual para asegurarse de que no existe embarazo. INFORME A SU MDICO:  Cualquier alergia que tenga.  Todos los U[employer] Corporation [place], incluyendo vitaminas, hierbas, gotas oftlmicas, cremas y medicamentos de venta libre.  Problemas previos que usted o los [employer] de su familia hayan tenido con el uso de anestsicos.  Enfermedades de Campbell Soup.  Cirugas previas.  Padecimientos mdicos.  RIESGOS Y COMPLICACIONES Generalmente es un procedimiento seguro. Sin embargo, Games developer procedimiento, pueden surgir complicaciones. Las complicaciones posibles son:  Perforacin del tero.  Sangrado excesivo.  Infeccin.  Lesin en el cuello del tero.  Lesiones en otros rganos.  Reaccin alrgica a un medicamento.  Inoculacin de lquido en exceso dentro del tero. ANTES DEL PROCEDIMIENTO  Consulte a su mdico si debe cambiar o suspender los medicamentos que toma habitualmente.  No tome aspirina ni anticoagulantes durante la semana previa al procedimiento, o segn le hayan indicado. Pueden ocasionar hemorragias.  Si fuma, no lo haga durante las AT[employer]T al procedimiento.  En algunos casos, el da anterior al procedimiento se coloca un medicamento en el cuello del tero. Este medicamento dilata el cuello y [place] la abertura. Esto facilita la insercin del instrumento en el tero durante el procedimiento.  No debe comer ni beber nada durante al menos 8 horas antes de la [place].  Pdale a alguna persona que la lleve a su casa luego del procedimiento.  PROCEDIMIENTO  Le administrarn un medicamento para relajarse (sedante). Tambin podrn administrarle uno de los siguientes medicamentos: ? Medicamentos que adormecen el rea del cuello uterino (anestesia local). ? Un medicamento para que duerma durante el procedimiento (anestesia  genera).  El histeroscopio se inserta a travs de la vagina, dentro del tero. La cmara del laparoscopio enva una imagen a una pantalla de televisin que se encuentra en el quirfano. [employer] modo, el mdico tendr Ardelia Mems buena visin del interior del tero.  Durante el Wellsite geologist un lquido o aire en el interior de [place], lo que le permitir al cirujano observar mejor.  En algunas ocasiones, el tejido del interior del tero se legra suavemente. Esas muestras de tejido se envan al laboratorio para ser examinadas.  DESPUS DEL PROCEDIMIENTO  Si le han administrado anestesia general podr sentirse atontada durante algunas horas despus del procedimiento.  Si se utiliza Tour manager, podr volver a su casa tan pronto como est estable y se sienta en condiciones.  Podr sentir clicos leves. Es normal que esto dure un par [employer].  Podr tener una hemorragia, la que puede variar entre una pequea mancha durante algunos das hasta una hemorragia similar a Mining engineer durante 3-7 das. Esto es normal.  Si el resultado de los estudios no estn listos durante la visita, tenga otra entrevista con su mdico para conocerlos.  Esta informacin no tiene Marine scientist el consejo del mdico. Asegrese de hacerle al mdico cualquier pregunta que tenga. Document Released: 04/13/2005 Document Revised: 02/01/2013 Document Reviewed: 11/10/2012 Elsevier Interactive Patient Education  2017 [place] (Hysteroscopy, Care After) Siga estas instrucciones durante las prximas semanas.  Estas indicaciones le proporcionan informacin general acerca de cmo deber cuidarse despus del procedimiento. El mdico tambin podr darle instrucciones ms especficas. El tratamiento se ha planificado de acuerdo a las prcticas mdicas actuales, pero a veces se producen problemas. Comunquese con el mdico si tiene algn problema o tiene dudas despus del  procedimiento. QU ESPERAR DESPUS DEL PROCEDIMIENTO Despus del procedimiento, es tpico tener las siguientes sensaciones:  Podr sentir clicos leves. Es normal que esto dure un par [employer].  Aumenta el sangrado. Puede variar desde un sangrado ligero durante un par de Intel un sangrado similar al menstrual por 3 - 7 das. [place] durante los primeros 1-2 das despus del procedimiento.  Tome slo medicamentos de venta libre o recetados, segn las indicaciones del mdico. No tome aspirina. La aspirina puede aumentar el riesgo de sangrado.  Tome slo duchas y no baos durante 2 semanas, segn las indicaciones de su mdico.  No conduzca por 24 horas o siga las indicaciones de su mdico.  No beba alcohol si toma analgsicos.  No utilice tampones, duchas vaginales ni tenga relaciones sexuales durante 2 semanas o hasta que el profesional la autorice.  Tmese la [employer] International Group por da, durante 4  5 das. Antela cada vez.  Siga las indicaciones de su mdico acerca de la dieta, la actividad fsica y levantar objetos pesados.  Si est constipada, usted puede: ? Tomar un laxante suave si su mdico la autoriza. ? Agregar salvado a su dieta. ? Beba suficiente lquido para mantener la orina clara o de color amarillo plido.  Pdale a alguna persona que permanezca con usted durante las primeras 24 a 48 horas, especialmente si le han administrado anestesia general.  Concurra a las consultas de control con su mdico segn las indicaciones.  SOLICITE ATENCIN MDICA SI:  Se siente mareado o sufre un desmayo.  Tiene Higher education careers adviser (nuseas).  Tiene flujo vaginal anormal.  Tiene una erupcin.  Aumenta el dolor y no puede controlarlo con Conservation officer, nature.  SOLICITE ATENCIN MDICA DE INMEDIATO SI:  Tiene cogulos de sangre o una hemorragia ms abundante que un perodo menstrual normal.  Tiene fiebre.  Aumenta el dolor y no  puede controlarlo con Conservation officer, nature.  Siente un dolor nuevo en el vientre (abdominal).  Se desmaya.  Tiene dolor en los hombros (en la zona donde van los breteles).  Le falta el aire.  Esta informacin no tiene Marine scientist el consejo del mdico. Asegrese de hacerle al mdico cualquier pregunta que tenga. Document Released: 02/01/2013 Document Revised: 02/01/2013 Document Reviewed: 11/10/2012 Elsevier Interactive Patient Education  2017 [place] y curetaje o curetaje por aspiracin, cuidados posteriores (Dilation and Curettage or Vacuum Curettage, Care After) Lea esta informacin sobre cmo cuidarse despus del procedimiento. El mdico tambin podr darle instrucciones ms especficas. Comunquese con el mdico si tiene problemas o preguntas. QU ESPERAR DESPUS DEL PROCEDIMIENTO Despus del procedimiento, es comn Abbott Laboratories siguientes sntomas:  Dolor o calambres leves.  Un poco de sangrado o hemorragia vaginal. Estos sntomas pueden durar hasta 2semanas despus del procedimiento. INSTRUCCIONES PARA EL CUIDADO EN EL HOGAR Actividad  No conduzca ni use maquinaria pesada mientras toma analgsicos recetados.  Evite conducir durante las primeras 24horas despus del procedimiento.  Realice caminatas cortas y frecuentes, seguidas de perodos de Occupational hygienist. Pregntele al mdico qu actividades son seguras para usted. Despus de 1 o 2das, podr retomar sus [employer] International.  No  levante ningn objeto que sea ms pesado que 10libras (4,5kg) hasta que el mdico la autorice.  Durante al menos 2semanas o el tiempo que le haya indicado el mdico, no debe: ? Patent attorney. ? Usar tampones. ? Tener [employer]. Instrucciones generales  Delphi de venta libre y los recetados solamente como se lo haya indicado el mdico. Esto es muy importante si toma anticoagulantes.  No tome baos de inmersin, no nade ni  use el jacuzzi hasta que el mdico lo autorice. Tome Dentist de baos.  Use las medias de compresin como se lo haya indicado el mdico. Estas medias ayudan a Mining engineer formacin de cogulos sanguneos y a Clinical cytogeneticist hinchazn de las piernas.  Es su responsabilidad retirar Gap Inc del procedimiento. Consulte a su mdico o en el departamento donde se realice el procedimiento cundo estarn [employer].  Concurra a todas las visitas de control como se lo haya indicado el mdico. Esto es importante. SOLICITE ATENCIN MDICA SI:  Tiene calambres intensos que empeoran o no mejoran con los medicamentos.  Siente un dolor abdominal intenso.  No puede beber lquidos sin vomitar.  Siente dolor en otra zona de la pelvis.  Tiene secrecin vaginal con mal olor.  Tiene una erupcin cutnea. SOLICITE ATENCIN MDICA DE INMEDIATO SI:  Tiene una hemorragia abdominal que empapa ms de una compresa higinica en 1hora, durante 2horas consecutivas.  Elimina cogulos grandes por la vagina.  Tiene fiebre de ms de 100,71F (38C).  Siente dureza o dolor a Equities trader.  Siente dolor en el pecho.  Le falta el aire.  Tose y escupe sangre.  Se siente mareada o que va a desvanecerse.  Se desmaya.  Siente dolor en la zona del cuello o los hombros. Esta informacin no tiene como fin reemplazar el consejo del mdico. Asegrese de hacerle al mdico cualquier pregunta que tenga. Document Released: 01/21/2005 Document Revised: 04/18/2013 Document Reviewed: 11/14/2015 Elsevier Interactive Patient Education  2018 [place]  Dilatacin y curetaje o curetaje por aspiracin, cuidados posteriores (Dilation and Curettage or Vacuum Curettage, Care After) Estas indicaciones le proporcionan informacin acerca de cmo deber cuidarse despus del procedimiento. El mdico tambin podr darle instrucciones especficas. Comunquese con el mdico si tiene algn problema  o tiene preguntas despus del procedimiento. CUIDADOS EN EL HOGAR  No conduzca durante 24horas.  Espere 1 semana antes de realizar Lennar Corporation la [employer] Corporation.  [place] (2) veces al da, durante 4 [place]. Antelas. Comunquese con su mdico si tiene fiebre.  No permanezca de pie durante mucho tiempo.  No levante, empuje ni jale objetos de ms de 10 libras (4,5 kilogramos).  Solo suba escaleras una o dos veces por da.  Descanse con frecuencia.  Siga con su dieta habitual.  Beba suficiente lquido para mantener el pis (orina) claro o de color amarillo plido.  Si tiene dificultad para Administrator, sports (estreimiento), puede hacer lo siguiente: ? Tome algn medicamento que la ayude a Film/video editor intestino (laxante) segn las indicaciones de su mdico. ? Consuma ms fruta y salvado. ? Beba ms lquidos.  FPL Group ducha, no un bao de inmersin, durante el tiempo que le indique su mdico.  No practique natacin ni use el jacuzzi hasta que el mdico la autorice.  Pdale a alguien que se quede con usted durante 1 o 2das despus del procedimiento.  No se haga duchas vaginales, no use tampones ni tenga sexo Electronics engineer) durante  2 semanas.  Solo tome los UAL Corporation le haya indicado su mdico. No tome aspirina. Puede ocasionar hemorragias.  Cumpla con los controles mdicos.  SOLICITE AYUDA SI:  Tiene clicos o siente un dolor que no puede controlar con los medicamentos.  Siente dolor en el vientre (abdomen).  Advierte un olor ftido que proviene de la vagina.  Tiene una erupcin cutnea.  Tiene problemas con los medicamentos.  SOLICITE AYUDA DE INMEDIATO SI:  Tiene una hemorragia ms abundante que un perodo normal.  Tiene fiebre.  Siente dolor en el pecho.  Tiene dificultad para respirar.  Se siente mareada o siente como si fuera a desmayarse (vahdo).  Se desmaya.  Siente dolor en la zona superior de los hombros.  Tiene  una hemorragia vaginal, con o sin grumos de sangre (cogulos sanguneos).  ASEGRESE DE QUE:  Comprende estas instrucciones.  Controlar su afeccin.  Recibir ayuda de inmediato si no mejora o si empeora.  Esta informacin no tiene Marine scientist el consejo del mdico. Asegrese de hacerle al mdico cualquier pregunta que tenga. Document Released: 12/10/2010 Document Revised: 04/18/2013 Document Reviewed: 11/10/2012 Elsevier Interactive Patient Education  2017 [place] endometrio (Endometrial Ablation) En la ablacin del endometrio se extirpa el recubrimiento interno del tero (endometrio). Este es generalmente un tratamiento que se Scientist, water quality, en forma ambulatoria. La ablacin permite evitar una ciruga mayor, como la ciruga para extirpar el cuello del tero y Nurse, learning disability (histerectoma). Luego de la ablacin del endometrio, tendr poco o nada de flujo menstrual y no podr tener hijos. Sin embargo, si se encuentra en la etapa de la premenopausia, deber usar un mtodo confiable de control de la natalidad luego del procedimiento, ya que hay una pequea probabilidad de que ocurra un Media planner. Hay diferentes razones para llevar a cabo este procedimiento, ellas son:  Perodos abundantes.  Sangrado que causa anemia.  Sangrado irregular.  Fibromas que sangran en la superficie interior del tero, si no miden ms de 3 cm. Tal vez no sea posible realizarle este procedimiento si:  Desea tener hijos en el futuro.  Tiene clicos intensos durante el perodo menstrual.  Tiene clulas precancerosas o cancerosas en el tero.  Tuvo un embarazo reciente.  Est cursando la menopausia.  Se someti a [place] mayor de tero, la cual le produjo el afinamiento de la pared [place]. Las cirugas pueden incluir lo siguiente: ? La extirpacin de uno o ms fibromas uterinos (miomectoma). ? Ardelia Mems cesrea con una incisin clsica (vertical) en el tero. Pregntele al  mdico qu tipo de Pensions consultant. En ocasiones, la Arts development officer piel es diferente de la que se forma en el tero. Aunque se haya sometido a una ciruga uterina, algunos tipos de ablacin an pueden ser seguros para su caso. Hable con el mdico. INFORME A SU MDICO:  Cualquier alergia que tenga.  Todos los U[employer] Corporation [place], incluyendo vitaminas, hierbas, gotas oftlmicas, cremas y medicamentos de venta libre.  Problemas previos que usted o los UnitedHealth de su familia hayan tenido con el uso de anestsicos.  Enfermedades de Campbell Soup.  Cirugas previas.  Padecimientos mdicos.  RIESGOS Y COMPLICACIONES Generalmente, este es un procedimiento seguro. Sin embargo, Games developer procedimiento, pueden surgir complicaciones. Las complicaciones posibles son:  Perforacin del tero.  Hemorragias.  Infeccin en el tero, vejiga, o vagina.  Lesin en los rganos circundantes.  Ardelia Mems burbuja de aire en un pulmn (embolia de aire).  Embarazo luego del procedimiento.  Si el procedimiento no resuelve el problema, ser necesaria una histerectoma.  Disminucin de la capacidad para Community education officer en el recubrimiento interno del tero. ANTES DEL PROCEDIMIENTO  Deber controlarse la membrana que recubre el tero para asegurarse de que no hay clulas cancerosas o precancerosas.  Le harn una ecografa para observar el tamao del tero y verificar si hay anormalidades.  Le darn medicamentos para Engineer, structural de la membrana que recubre el tero.  PROCEDIMIENTO Durante el procedimiento, el medico usar un instrumento llamado resectoscopio para ver el interior del tero. Hay diferentes formas de extirpar la membrana que cubre el tero.  Radiofrecuencia: en este mtodo se utiliza un aparato de radiofrecuencia, corriente elctrica alterna, para extirpar la membrana interna del tero.  Crioterapia: en este mtodo se [place] fro extremo para congelar la membrana  del tero.  Lquido caliente: en este mtodo se utiliza solucin salina con el mismo objetivo.  Microondas: Se utilizan microondas de alta energa para calentar la membrana y extirparla.  Baln termal: consiste en insertar un catter con un baln en la punta dentro del tero. La punta del baln contiene un lquido caliente que elimina la membrana que tapiza el [place]. DESPUS DEL PROCEDIMIENTO Despus del procedimiento, no tenga relaciones sexuales ni inserte nada en la vagina hasta que su mdico la autorice. Despus del procedimiento podr experimentar:  Clicos.  Flujo vaginal.  Ganas de orinar con frecuencia. Esta informacin no tiene Marine scientist el consejo del mdico. Asegrese de hacerle al mdico cualquier pregunta que tenga. Document Released: 12/14/2012 Document Revised: 01/02/2015 Document Reviewed: 09/14/2012 Elsevier Interactive Patient Education  2017 [place] general en los adultos (General Anesthesia, Adult) La anestesia general es el uso de medicamentos para dormir a Furniture conservator/restorer persona (dejarla inconsciente) para una intervencin [place]. La anestesia general suele recomendarse cuando una ciruga:  Va a durar [employer].  Requiere que se quede quieto o que est en una posicin inusual.  Es mayor y puede provocar que pierda [place].  Es imposible de Optometrist sin anestesia general. Los medicamentos utilizados para la anestesia general se llaman anestsicos generales. Adems de mantenerlo dormido, estos medicamentos:  Teacher, music.  Controlan la presin arterial.  Relajan los msculos. INFORME A SU MDICO:  Cualquier alergia que tenga.  Todos los [employer], incluidos vitaminas, hierbas, gotas oftlmicas, cremas y medicamentos de venta libre.  Cualquier problema que usted o sus familiares hayan tenido con la anestesia.  Tipos de anestsicos que le hayan dado en el pasado.  Cualquier trastorno hemorrgico que  tenga.  Cirugas previas.  Cualquier enfermedad que tenga.  Cualquier antecedente de afecciones cardacas o pulmonares, como insuficiencia cardaca, apnea del sueo o enfermedad pulmonar obstructiva crnica (EPOC).  Si est embarazada o podra estarlo.  Si consume tabaco, alcohol, marihuana o drogas.  Antecedentes de [employer].  Antecedentes de depresin o ansiedad. RIESGOS Y COMPLICACIONES En general, se trata de un procedimiento seguro. Sin embargo, pueden presentarse problemas, por ejemplo:  Reaccin alrgica a la anestesia.  Problemas cardacos o pulmonares.  Inhalar alimentos o lquidos del estmago a los pulmones (aspiracin).  Lesin en los nervios.  Despertarse durante la [place] y no poder moverse (raro).  Nerviosismo extremo o estado de confusin mental (delirio) al despertarse de la anestesia.  Aire en el torrente sanguneo, que puede provocar un ictus. Es ms probable que estos problemas surjan en caso de que le realicen una ciruga mayor o si tiene una enfermedad terminal. Puede evitar algunas de estas  complicaciones respondiendo a todas las preguntas del mdico concienzudamente y siguiendo todas las indicaciones previas a la [place]. La anestesia general puede causar efectos secundarios, como por ejemplo:  Nuseas o vmitos  Dolor de garganta causado por el tubo endotraqueal.  Fro o escalofros.  Sentirse cansado, sin fuerzas o dolorido.  Sueo o somnolencia.  Confusin o nerviosismo. ANTES DEL PROCEDIMIENTO Mantenerse hidratado Siga las indicaciones del mdico acerca de la hidratacin, las cuales pueden incluir lo siguiente:  Hasta 2horas antes del procedimiento, puede beber lquidos transparentes, como agua, jugos frutales transparentes, caf negro y t solo. Restricciones en las comidas y bebidas Prosperity comidas y las bebidas, las cuales pueden incluir lo siguiente:  Ocho horas antes del  procedimiento, deje de ingerir comidas o alimentos pesados, por ejemplo, carne, alimentos fritos o alimentos grasos.  Seis horas antes del procedimiento, deje de ingerir comidas o alimentos livianos, como tostadas o cereales.  Seis horas antes del procedimiento, deje de beber [place] o bebidas que [employer].  Dos horas antes del procedimiento, deje de beber lquidos transparentes. Medicamentos  Consulte al mdico si debe hacer o no lo siguiente: ? Cambiar o suspender los medicamentos que toma habitualmente. Esto es muy importante si toma medicamentos para la diabetes o anticoagulantes. ? Tomar medicamentos como aspirina e ibuprofeno. Estos medicamentos pueden tener un efecto anticoagulante en la [place]. No tome estos medicamentos antes del procedimiento si el mdico le indica que no lo haga. ? Tomar complementos alimenticios o medicamentos nuevos. No tome estos medicamentos durante la semana anterior a la ciruga, a menos que el mdico lo autorice.  Si le indican que debe tomar un medicamento o que debe continuar tomando un medicamento el da de la [place], tmelo con sorbos de [employer]. Instrucciones generales  Pregunte si volver a su [place], o si debe quedarse en el hospital por [employer]. ? Pdale a alguien que lo lleve a su casa. ? Pdale a alguien que se quede con usted durante las primeras 24horas despus de salir del hospital o de la clnica.  No consuma ningn producto que contenga tabaco, como cigarrillos, tabaco de Higher education careers adviser y Psychologist, sport and exercise, durante al menos 3 a 6semanas antes de la [place].  Puede lavarse los dientes la maana de la [place], pero asegrese de escupir la pasta de dientes. PROCEDIMIENTO  Le aplicarn la anestesia con Judene Companion y por va intravenosa (IV).  Es posible que le den un medicamento para ayudarlo a Nurse, children's (sedante).  Una vez que est dormido, es posible que le coloquen un tubo para ayudarlo a  Ambulance person.  Un anestesista permanecer con usted durante toda la [place]. Continuar dndole los medicamentos que necesita y Secretary/administrator la dosis para mantenerlo cmodo y Engineer, maintenance. Tambin le controlarn la presin arterial, el pulso y los niveles de oxgeno para asegurarse de que no tenga ningn problema.  Si le colocaron un tubo para ayudarlo a Ambulance person, se lo quitarn antes de que se despierte. Este procedimiento puede variar segn el mdico y el hospital. DESPUS DEL PROCEDIMIENTO  Cuando termine la ciruga, por lo general, se despertar lentamente y en una sala de reanimacin.  Le controlarn la presin arterial, la frecuencia cardaca, la frecuencia respiratoria y Retail buyer de oxgeno en la sangre hasta que haya desaparecido el efecto de los medicamentos administrados.  Es posible que le den [employer] lo ayuden a calmarse si se siente nervioso o ansioso.  Si se ir a  su casa el mismo da, el mdico verificar si puede pararse, beber y Garment/textile technologist.  El mdico tambin tratar Conservation officer, historic buildings y los efectos secundarios antes de que se vaya a su casa.  No conduzca durante 24horas si le administraron un sedante.  Es posible que tenga o sienta lo siguiente: ? Nuseas o vmitos. ? Dolor de Investment banker, operational. ? Lentitud mental. ? Fro o escalofros. ? Somnolencia. ? Cansancio. ? Dolor o inflamacin, incluso en partes del cuerpo no afectadas por la ciruga. Esta informacin no tiene Marine scientist el consejo del mdico. Asegrese de hacerle al mdico cualquier pregunta que tenga. Document Released: 08/13/2010 Document Revised: 05/04/2014 Document Reviewed: 03/28/2015 Elsevier Interactive Patient Education  2018 [place] general en los adultos, cuidados posteriores (General Anesthesia, Adult, Care After) Estas indicaciones le proporcionan informacin acerca de cmo deber cuidarse despus del procedimiento. El mdico tambin podr darle instrucciones ms especficas. El tratamiento ha  sido planificado segn las prcticas mdicas actuales, pero en algunos casos pueden ocurrir problemas. Comunquese con el mdico si tiene algn problema o dudas despus del procedimiento. QU ESPERAR DESPUS DEL PROCEDIMIENTO Despus del procedimiento, es comn [employer] siguientes sntomas:  Vmitos.  Dolor de Investment banker, operational.  Lentitud mental. Es normal sentir lo siguiente:  Nuseas.  Fro o escalofros.  Somnolencia.  Cansancio.  Dolor o inflamacin, incluso en partes del cuerpo no afectadas por la ciruga. INSTRUCCIONES PARA EL CUIDADO EN EL HOGAR Durante al menos 24horas despus del procedimiento:  No haga lo siguiente: ? Participar en actividades que impliquen posibles cadas o lesiones. ? Conducir vehculos. ? Operar maquinarias pesadas. ? Beber alcohol. ? Tomar somnferos o medicamentos que causen somnolencia. ? Firmar documentos legales ni tomar [employer]. ? Cuidar a nios por su cuenta.  Hacer reposo. Comida y bebida  Si vomita, tome agua, jugo o sopa una vez que pueda beber sin vomitar.  Beba suficiente lquido para Consulting civil engineer orina clara o de color amarillo plido.  Asegrese de no tener nuseas antes de ingerir alimentos slidos.  Siga la dieta recomendada por el mdico. Instrucciones generales  Permanezca con un adulto responsable hasta que est completamente despierto y consciente.  Retome sus actividades normales como se lo haya indicado el mdico. Pregntele al mdico qu actividades son seguras para usted.  Tome los medicamentos de venta libre y los recetados solamente como se lo haya indicado el mdico.  Si fuma, no lo haga sin supervisin.  Concurra a todas las visitas de control como se lo haya indicado el mdico. Esto es importante. SOLICITE ATENCIN MDICA SI:  Contina con nuseas o vmitos en su casa, y los medicamentos no ayudan.  No puede beber lquidos ni volver a comer.  No puede orinar despus de 8 a 12horas.  Tiene  una erupcin cutnea.  Tiene fiebre.  Tiene cada vez ms enrojecimiento en la zona de la ciruga. SOLICITE ATENCIN MDICA DE INMEDIATO SI:  Tiene dificultad para respirar.  Siente dolor en el pecho.  Tiene una hemorragia imprevista.  Siente que tiene un problema potencialmente mortal o urgente. Esta informacin no tiene Marine scientist el consejo del mdico. Asegrese de hacerle al mdico cualquier pregunta que tenga. Document Released: 04/13/2005 Document Revised: 05/04/2014 Document Reviewed: 03/28/2015 Elsevier Interactive Patient Education  [employer].

## 2017-04-07 ENCOUNTER — Encounter (HOSPITAL_COMMUNITY): Payer: Self-pay

## 2017-04-07 ENCOUNTER — Other Ambulatory Visit: Payer: Self-pay

## 2017-04-07 ENCOUNTER — Other Ambulatory Visit: Payer: Self-pay | Admitting: Obstetrics and Gynecology

## 2017-04-07 ENCOUNTER — Encounter (HOSPITAL_COMMUNITY)
Admission: RE | Admit: 2017-04-07 | Discharge: 2017-04-07 | Disposition: A | Discharge status: Home / Self Care | Payer: Self-pay | Source: Ambulatory Visit | Attending: Obstetrics and Gynecology | Admitting: Obstetrics and Gynecology

## 2017-04-07 DIAGNOSIS — N920 Excessive and frequent menstruation with regular cycle: Secondary | ICD-10-CM | POA: Insufficient documentation

## 2017-04-07 DIAGNOSIS — D508 Other iron deficiency anemias: Secondary | ICD-10-CM | POA: Insufficient documentation

## 2017-04-07 LAB — COMPREHENSIVE METABOLIC PANEL
ALBUMIN: 3.9 g/dL (ref 3.5–5.0)
ALK PHOS: 67 U/L (ref 38–126)
ALT: 38 U/L (ref 14–54)
AST: 26 U/L (ref 15–41)
Anion gap: 8 (ref 5–15)
BILIRUBIN TOTAL: 0.5 mg/dL (ref 0.3–1.2)
BUN: 11 mg/dL (ref 6–20)
CO2: 23 mmol/L (ref 22–32)
CREATININE: 0.69 mg/dL (ref 0.44–1.00)
Calcium: 9.3 mg/dL (ref 8.9–10.3)
Chloride: 106 mmol/L (ref 101–111)
GFR calc Af Amer: >60 mL/min (ref >60)
GLUCOSE: 118 mg/dL — AB (ref 65–99)
POTASSIUM: 3.7 mmol/L (ref 3.5–5.1)
Sodium: 137 mmol/L (ref 135–145)
TOTAL PROTEIN: 7.7 g/dL (ref 6.5–8.1)

## 2017-04-07 LAB — CBC
HEMATOCRIT: 28.7 % — AB (ref 36.0–46.0)
HEMOGLOBIN: 8.3 g/dL — AB (ref 12.0–15.0)
MCH: 22.1 pg — ABNORMAL LOW (ref 26.0–34.0)
MCHC: 28.9 g/dL — ABNORMAL LOW (ref 30.0–36.0)
MCV: 76.5 fL — AB (ref 78.0–100.0)
Platelets: 451 10*3/uL — ABNORMAL HIGH (ref 150–400)
RBC: 3.75 MIL/uL — AB (ref 3.87–5.11)
RDW: 17.3 % — AB (ref 11.5–15.5)
WBC: 10.5 10*3/uL (ref 4.0–10.5)

## 2017-04-07 LAB — HCG, SERUM, QUALITATIVE: PREG SERUM: NEGATIVE

## 2017-04-07 NOTE — Pre-Procedure Instructions (Signed)
HGB 8.3 reported to Dr Patsey Berthold and Glo Herring.  Will repeat ISTAT day of surgery.

## 2017-04-07 NOTE — Progress Notes (Signed)
Pre-op History and Physical Patient is a 46 y.o. female scheduled for hysteroscopy dilation and curettage with endometrial ablation. Indications for procedure are menorrhagia, anemia, desire to avoid hysterectomy.  Onset of symptoms was a few Yearsago. Symptoms have been rapidly worsening since that time. . The patient has had a tubal ligation.  Endometrial biopsy is benign.  Pap smear is normal by health department .  The patient has been intermittently anemic.  Hemoglobin is of range from 8.4-11.4 in the recent past. Past history includes .  Tubal ligation previous studies include ultrasound and endometrial biopsy.  The endometrial biopsy was benign ultrasound suggested a moderately enlarged uterus with symmetric endometrium and fibroid enlargement of the muscle myometrium.  Pertinent Gynecological History:  Menses: regular every 28 days without intermenstrual spotting Bleeding: Menorrhagia Contraception: tubal ligation DES exposure: unknown Blood transfusions: none Sexually transmitted diseases: no past history Preventive screening:  Last mammogram: normal Date: 2015 Last pap: normal Date: *  Discussed Blood/Blood Products: not applicable  Menstrual History: OB History    Gravida Para Term Preterm AB Living   5 5 5     5    SAB TAB Ectopic Multiple Live Births           76      Menarche age:  No LMP recorded.

## 2017-04-08 ENCOUNTER — Telehealth: Payer: Self-pay | Admitting: Obstetrics and Gynecology

## 2017-04-08 NOTE — Progress Notes (Signed)
Reviewed case with Dr Glo Herring and Dr Patsey Berthold.  Per Dr Patsey Berthold, case will need to be cancelled, as she does not consent to receiving blood products.  Blair Hailey notified to make office aware.

## 2017-04-08 NOTE — Progress Notes (Deleted)
Discussed case with Dr Glo Herring, as patient has not agreed to receive any blood products and has signed consent declining such.  States he will review and let us know if a change in plan is needed.

## 2017-04-13 ENCOUNTER — Encounter (HOSPITAL_COMMUNITY): Admission: RE | Payer: Self-pay | Source: Ambulatory Visit

## 2017-04-13 ENCOUNTER — Ambulatory Visit (HOSPITAL_COMMUNITY): Admission: RE | Admit: 2017-04-13 | Payer: Self-pay | Source: Ambulatory Visit | Admitting: Obstetrics and Gynecology

## 2017-04-13 SURGERY — DILATATION & CURETTAGE/HYSTEROSCOPY WITH NOVASURE ABLATION
Anesthesia: General

## 2017-04-21 ENCOUNTER — Encounter: Payer: Self-pay | Admitting: Obstetrics and Gynecology

## 2017-04-23 ENCOUNTER — Encounter: Payer: Self-pay | Admitting: Obstetrics and Gynecology

## 2017-04-23 ENCOUNTER — Ambulatory Visit (INDEPENDENT_AMBULATORY_CARE_PROVIDER_SITE_OTHER): Payer: Self-pay | Admitting: Obstetrics and Gynecology

## 2017-04-23 VITALS — BP 114/66 | HR 84 | Ht 63.0 in | Wt 208.0 lb

## 2017-04-23 DIAGNOSIS — D500 Iron deficiency anemia secondary to blood loss (chronic): Secondary | ICD-10-CM

## 2017-04-23 DIAGNOSIS — D649 Anemia, unspecified: Secondary | ICD-10-CM

## 2017-04-23 LAB — POCT HEMOGLOBIN: Hemoglobin: 7.2 g/dL — AB (ref 12.2–16.2)

## 2017-04-23 NOTE — Progress Notes (Addendum)
   [facility] Visit  04/23/2017           Patient name: Sarah Foley MRN [id number]  Date of birth: [birth year]  CC & HPI:  Sarah Foley is a 46 y.o. female presenting today for f/u appointment, and has been bleeding heavily every day for the past three months. She had an endometrial biopsy done on 03/10/17. Her initial complaint is menorrhagia. No associated symptoms noted. She has tried an IUD, Micronor, Megace, with some temporary relief. Previously, pt was anemic due to her menorrhagia, per chart review.  Pt is Spanish-speaking, but does not have a translator present today. Pt is a Jehovah's witness.  ROS:  ROS  (+) menorrhagia for 3 months (-) fever All systems are negative except as noted in the HPI and PMH.   Pertinent History Reviewed:   Reviewed: Significant for menorrhagia, anemia Medical         Past Medical History:  Diagnosis Date  . Anemia   . Cataract    Leye  . Depression   . No pertinent past medical history                               Surgical Hx:    Past Surgical History:  Procedure Laterality Date  . CATARACT EXTRACTION, BILATERAL Bilateral 2010  . TUBAL LIGATION  06/2010   neg HCG prior to tubal +hcg 1 week after   Medications: Reviewed & Updated - see associated section                       Current Outpatient Medications:  .  IRON PO, Take 2 tablets by mouth 2 (two) times daily. , Disp: , Rfl:  .  megestrol (MEGACE) 40 MG tablet, Take 3 daily for 5 days then 2 daily (Patient not taking: Reported on 04/23/2017), Disp: 75 tablet, Rfl: 3 .  OVER THE COUNTER MEDICATION, Take 2 tablets by mouth daily as needed (for constipation). Prunelax Laxative, Disp: , Rfl:    Social History: Reviewed -  reports that  has never smoked. she has never used smokeless tobacco.  Objective Findings:  Vitals: Blood pressure 114/66, pulse 84, height 5\' 3"  (1.6 m), weight 208 lb (94.3 kg), last menstrual period 04/07/2017.  Physical  Examination: General appearance - alert, well appearing, and in no distress Mental status - alert, oriented to person, place, and time Pelvic -  VULVA: normal appearing vulva with no masses, tenderness or lesions,  VAGINA: normal appearing vagina with normal color and discharge, no lesions,  CERVIX: multiparous cervix, no lesions, good support UTERUS: uterus is moderately enlarged, 411 g, 10-12 week size ADNEXA: normal adnexa in size, nontender and no masses    Assessment & Plan:   A:  1. Menorrhagia, uterine fibroids, anemia 2. Moderately enlarged uterus, 10-12 weeks size, 411 g  P:  1. F/u in 10 days for gyn 2 cbc anemia panel 3 translator at f/u visit. 4 may refer  By signing my name below, I, Izna Ahmed, attest that this documentation has been prepared under the direction and in the presence of [name], MD. Electronically Signed: Jabier Gauss, Medical Scribe. 04/23/17. 9:41 AM.  I personally performed the services described in this documentation, which was SCRIBED in my presence. The recorded information has been reviewed and considered accurate. It has been edited as necessary during review. [name], MD

## 2017-04-28 NOTE — Progress Notes (Signed)
Patient did NOT get anemia panel labs drawn after office visit as requested.

## 2017-04-29 ENCOUNTER — Ambulatory Visit: Payer: Self-pay | Admitting: Physician Assistant

## 2017-04-29 ENCOUNTER — Encounter: Payer: Self-pay | Admitting: Physician Assistant

## 2017-04-29 VITALS — BP 106/62 | HR 74 | Temp 97.7°F | Ht 63.0 in | Wt 207.5 lb

## 2017-04-29 DIAGNOSIS — N920 Excessive and frequent menstruation with regular cycle: Secondary | ICD-10-CM

## 2017-04-29 DIAGNOSIS — D649 Anemia, unspecified: Secondary | ICD-10-CM

## 2017-04-29 DIAGNOSIS — F329 Major depressive disorder, single episode, unspecified: Secondary | ICD-10-CM

## 2017-04-29 DIAGNOSIS — E669 Obesity, unspecified: Secondary | ICD-10-CM

## 2017-04-29 DIAGNOSIS — F32A Depression, unspecified: Secondary | ICD-10-CM

## 2017-04-29 DIAGNOSIS — Z1239 Encounter for other screening for malignant neoplasm of breast: Secondary | ICD-10-CM

## 2017-04-29 MED ORDER — CITALOPRAM HYDROBROMIDE 20 MG PO TABS: ORAL_TABLET | ORAL | 0 refills | Status: AC

## 2017-04-29 NOTE — Progress Notes (Signed)
BP 106/62 (BP Location: Left Arm, Patient Position: Sitting, Cuff Size: Large)   Pulse 74   Temp 97.7 F (36.5 C) (Other (Comment))   Ht 5\' 3"  (1.6 m)   Wt 207 lb 8 oz (94.1 kg)   LMP 04/07/2017   SpO2 98%   BMI 36.76 kg/m    Subjective:    Patient ID: Sarah Foley, female    DOB: 26-Mar-1971, 47 y.o.   MRN: [id number]  HPI: Sarah Foley is a 47 y.o. female presenting on 04/29/2017 for Follow-up   HPI   Pt was due to follow up here in June 2018 but she was a no-show and is just now coming in to be seen.   Pt saw gyn last week for menorrhagia and has follow up scheduled for 05/12/17.  Pt has been off her citalopram for a long time now since she hasn't been coming in for her appts.  She says her depression is "bad a little bit".  She would like to go back on the med.  She is counseled that she will need to come in for her appts and she agrees.    Relevant past medical, surgical, family and social history reviewed and updated as indicated. Interim medical history since our last visit reviewed. Allergies and medications reviewed and updated.   Current Outpatient Medications:  .  IRON PO, Take 2 tablets by mouth 2 (two) times daily. , Disp: , Rfl:  .  OVER THE COUNTER MEDICATION, Take 2 tablets by mouth daily as needed (for constipation). Prunelax Laxative, Disp: , Rfl:  .  vitamin C (ASCORBIC ACID) 500 MG tablet, Take 500 mg by mouth daily., Disp: , Rfl:  .  megestrol (MEGACE) 40 MG tablet, Take 3 daily for 5 days then 2 daily (Patient not taking: Reported on 04/23/2017), Disp: 75 tablet, Rfl: 3   Review of Systems  Constitutional: Positive for diaphoresis and fatigue. Negative for appetite change, chills, fever and unexpected weight change.  HENT: Negative for congestion, drooling, ear pain, facial swelling, hearing loss, mouth sores, sneezing, sore throat, trouble swallowing and voice change.   Eyes: Negative for pain, discharge, redness, itching and  visual disturbance.  Respiratory: Negative for cough, choking, shortness of breath and wheezing.   Cardiovascular: Negative for chest pain, palpitations and leg swelling.  Gastrointestinal: Negative for abdominal pain, blood in stool, constipation, diarrhea and vomiting.  Endocrine: Negative for cold intolerance, heat intolerance and polydipsia.  Genitourinary: Negative for decreased urine volume, dysuria and hematuria.  Musculoskeletal: Negative for arthralgias, back pain and gait problem.  Skin: Negative for rash.  Allergic/Immunologic: Negative for environmental allergies.  Neurological: Positive for headaches. Negative for seizures, syncope and light-headedness.  Hematological: Negative for adenopathy.  Psychiatric/Behavioral: Positive for dysphoric mood. Negative for agitation and suicidal ideas. The patient is not nervous/anxious.     Per HPI unless specifically indicated above     Objective:    BP 106/62 (BP Location: Left Arm, Patient Position: Sitting, Cuff Size: Large)   Pulse 74   Temp 97.7 F (36.5 C) (Other (Comment))   Ht 5\' 3"  (1.6 m)   Wt 207 lb 8 oz (94.1 kg)   LMP 04/07/2017   SpO2 98%   BMI 36.76 kg/m   Wt Readings from Last 3 Encounters:  04/29/17 207 lb 8 oz (94.1 kg)  04/23/17 208 lb (94.3 kg)  03/24/17 208 lb 8 oz (94.6 kg)    Physical Exam  Constitutional: She is oriented to person, place, and time.  She appears well-developed and well-nourished.  HENT:  Head: Normocephalic and atraumatic.  Neck: Neck supple.  Cardiovascular: Normal rate and regular rhythm.  Pulmonary/Chest: Effort normal and breath sounds normal.  Abdominal: Soft. Bowel sounds are normal. She exhibits no mass. There is no hepatosplenomegaly. There is no tenderness.  Musculoskeletal: She exhibits no edema.  Lymphadenopathy:    She has no cervical adenopathy.  Neurological: She is alert and oriented to person, place, and time.  Skin: Skin is warm and dry.  Psychiatric: She has a  normal mood and affect. Her behavior is normal.  Vitals reviewed.   Results for orders placed or performed in visit on 04/23/17  POCT hemoglobin  Result Value Ref Range   Hemoglobin 7.2 (A) 12.2 - 16.2 g/dL      Assessment & Plan:   Encounter Diagnoses  Name Primary?  . Depression, unspecified depression type Yes  . Screening for breast cancer   . Menorrhagia with regular cycle   . Anemia, unspecified type   . Obesity, unspecified classification, unspecified obesity type, unspecified whether serious comorbidity present      -Order screening mammogram.  -restart citalopram -pt to continue with gyn -follow up 3 wk to recheck mood.  RTO sooner prn

## 2017-05-10 ENCOUNTER — Other Ambulatory Visit (HOSPITAL_COMMUNITY)
Admission: RE | Admit: 2017-05-10 | Discharge: 2017-05-10 | Disposition: A | Discharge status: Home / Self Care | Payer: Self-pay | Source: Ambulatory Visit | Attending: Obstetrics and Gynecology | Admitting: Obstetrics and Gynecology

## 2017-05-10 DIAGNOSIS — D500 Iron deficiency anemia secondary to blood loss (chronic): Secondary | ICD-10-CM | POA: Insufficient documentation

## 2017-05-10 LAB — IRON AND TIBC
Iron: 225 ug/dL — ABNORMAL HIGH (ref 28–170)
SATURATION RATIOS: 47 % — AB (ref 10.4–31.8)
TIBC: 479 ug/dL — AB (ref 250–450)
UIBC: 254 ug/dL

## 2017-05-10 LAB — FERRITIN: FERRITIN: 15 ng/mL (ref 11–307)

## 2017-05-10 LAB — FOLATE: FOLATE: 28 ng/mL (ref >5.9)

## 2017-05-10 LAB — VITAMIN B12: VITAMIN B 12: 641 pg/mL (ref 180–914)

## 2017-05-12 ENCOUNTER — Other Ambulatory Visit: Payer: Self-pay

## 2017-05-12 ENCOUNTER — Ambulatory Visit (INDEPENDENT_AMBULATORY_CARE_PROVIDER_SITE_OTHER): Payer: Self-pay | Admitting: Obstetrics and Gynecology

## 2017-05-12 ENCOUNTER — Encounter: Payer: Self-pay | Admitting: Obstetrics and Gynecology

## 2017-05-12 ENCOUNTER — Other Ambulatory Visit (HOSPITAL_COMMUNITY)
Admission: RE | Admit: 2017-05-12 | Discharge: 2017-05-12 | Disposition: A | Discharge status: Home / Self Care | Payer: Self-pay | Source: Ambulatory Visit | Attending: Obstetrics and Gynecology | Admitting: Obstetrics and Gynecology

## 2017-05-12 VITALS — BP 118/72 | HR 93 | Ht 65.0 in | Wt 209.0 lb

## 2017-05-12 DIAGNOSIS — Z862 Personal history of diseases of the blood and blood-forming organs and certain disorders involving the immune mechanism: Secondary | ICD-10-CM

## 2017-05-12 DIAGNOSIS — Z124 Encounter for screening for malignant neoplasm of cervix: Secondary | ICD-10-CM | POA: Insufficient documentation

## 2017-05-12 LAB — POCT HEMOGLOBIN: Hemoglobin: 10.6 g/dL — AB (ref 12.2–16.2)

## 2017-05-12 NOTE — Progress Notes (Addendum)
Patient ID: Sarah Foley, female   DOB: 09-Mar-1971, 47 y.o.   MRN: [id number]   Preoperative History and Physical, visit conducted with translator  Lillianne Eick is a 47 y.o. [id number] here for surgical management of menorrhagia with regular cycle, anemia (unspecified).  And uterine enlargement 10-12-week size uterus.  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 She continues to bleed and it has been ongoing for the last three months. Her last pap was done about three years ago. No significant preoperative concerns.  Proposed surgery: Hysteroscopy D&C endometrial ablation with NovaSure or Minerva  Past Medical History:  Diagnosis Date  . Anemia   . Cataract    Leye  . Depression   . No pertinent past medical history    Past Surgical History:  Procedure Laterality Date  . CATARACT EXTRACTION, BILATERAL Bilateral 2010  . TUBAL LIGATION  06/2010   neg HCG prior to tubal +hcg 1 week after   OB History  Gravida Para Term Preterm AB Living  5 5 5     5   SAB TAB Ectopic Multiple Live Births          5    # Outcome Date GA Lbr Len/2nd Weight Sex Delivery Anes PTL Lv  5 Term 03/17/11 [redacted]w[redacted]d 04:00 / 00:11 8 lb 13.3 oz (4.006 kg) M Vag-Spont None  LIV     Birth Comments: facial bruising  4 Term 2008    F Vag-Spont   LIV  3 Term     M Vag-Spont   LIV  2 Term     M Vag-Spont   LIV  1 Term     F Vag-Spont   LIV    Patient denies any other pertinent gynecologic issues.   Current Outpatient Medications on File Prior to Visit  Medication Sig Dispense Refill  . citalopram (CELEXA) 20 MG tablet 1 po qd.  Tome una tableta por boca diaria 30 tablet 0  . IRON PO Take 2 tablets by mouth 2 (two) times daily.     . megestrol (MEGACE) 40 MG tablet Take 3 daily for 5 days then 2 daily 75 tablet 3  . OVER THE COUNTER MEDICATION Take 2 tablets by mouth daily as needed (for constipation). Prunelax Laxative    . vitamin C (ASCORBIC ACID) 500 MG  tablet Take 500 mg by mouth daily.     No current facility-administered medications on file prior to visit.    No Known Allergies  Social History:   reports that  has never smoked. she has never used smokeless tobacco. She reports that she does not drink alcohol or use drugs.  Family History  Problem Relation Age of Onset  . Hypertension Sister   . Mental retardation Daughter        chromosome number 12 is incomplete  . Anesthesia problems Neg Hx   . Hypotension Neg Hx   . Malignant hyperthermia Neg Hx   . Pseudochol deficiency Neg Hx     Review of Systems: Noncontributory  PHYSICAL EXAM: Blood pressure 118/72, pulse 93, height 5\' 5"  (1.651 m), weight 209 lb (94.8 kg). General appearance - alert, well appearing, and in no distress Chest - clear to auscultation, no wheezes, rales or rhonchi, symmetric air entry Heart - normal rate and regular rhythm Abdomen - soft, nontender, nondistended, no masses or organomegaly  Pelvic - examination  Uterus is 10-12 week size Extremities - peripheral pulses normal, no pedal edema, no clubbing  or cyanosis  Labs: Results for orders placed or performed in visit on 05/12/17 (from the past 336 hour(s))  POCT hemoglobin   Collection Time: 05/12/17 11:25 AM  Result Value Ref Range   Hemoglobin 10.6 (A) 12.2 - 16.2 g/dL  Results for orders placed or performed during the hospital encounter of 05/10/17 (from the past 336 hour(s))  Vitamin B12   Collection Time: 05/10/17  9:58 AM  Result Value Ref Range   Vitamin B-12 641 180 - 914 pg/mL  Folate   Collection Time: 05/10/17  9:58 AM  Result Value Ref Range   Folate 28.0 >5.9 ng/mL  Iron and TIBC   Collection Time: 05/10/17  9:58 AM  Result Value Ref Range   Iron 225 (H) 28 - 170 ug/dL   TIBC 479 (H) 250 - 450 ug/dL   Saturation Ratios 47 (H) 10.4 - 31.8 %   UIBC 254 ug/dL  Ferritin   Collection Time: 05/10/17  9:58 AM  Result Value Ref Range   Ferritin 15 11 - 307 ng/mL    Imaging  Studies: No results found.  Assessment: Je[employer]'s Witness, uterine fibroids, irregular and prolonged bleeding, mild anemia, normal endometrial biopsy Patient Active Problem List   Diagnosis Date Noted  . Left ovarian cyst 12/22/2016  . Depression 04/02/2016  . Class 2 obesity with body mass index (BMI) of 35.0 to 35.9 in adult 04/02/2016  . IUD check up 10/25/2014  . Menorrhagia with regular cycle 06/28/2014  . Iron deficiency anemia 06/28/2014    Plan: 1. Patient will undergo surgical management with hysteroscopy dilation and curettage and endometrial ablation with.  Minerva or NovaSure 2. PAP done 3. Repeat preoperative blood work 4. Will call with results and to schedule surgery and a follow-up postoperative visit   .mec 05/12/2017 11:39 AM By signing my name below, I, Margit Banda, attest that this documentation has been prepared under the direction and in the presence of [name], MD. Electronically Signed: Margit Banda, Medical Scribe. 05/12/17. 11:16 AM.  I personally performed the services described in this documentation, which was SCRIBED in my presence. The recorded information has been reviewed and considered accurate. It has been edited as necessary during review. [name], MD

## 2017-05-13 LAB — CYTOLOGY - PAP
Chlamydia: NEGATIVE
Diagnosis: NEGATIVE
HPV (WINDOPATH): NOT DETECTED
Neisseria Gonorrhea: NEGATIVE

## 2017-05-19 NOTE — Patient Instructions (Addendum)
Bailie Rosaldo-Landero  05/19/2017     '@[email or web address]' @   Your procedure is scheduled on  05/25/2017 .  Report to [facility] at  700   A.M.  Call this number if you have problems the morning of surgery:  [phone]   Remember:  Do not eat food or drink liquids after midnight.  Take these medicines the morning of surgery with A SIP OF WATER  Celexa, prilosec.   Do not wear jewelry, make-up or nail polish.  Do not wear lotions, powders, or perfumes, or deodorant.  Do not shave 48 hours prior to surgery.  Men may shave face and neck.  Do not bring valuables to the hospital.  [facility] is not responsible for any belongings or valuables.  Contacts, dentures or bridgework may not be worn into surgery.  Leave your suitcase in the car.  After surgery it may be brought to your room.  For patients admitted to the hospital, discharge time will be determined by your treatment team.  Patients discharged the day of surgery will not be allowed to drive home.   Name and phone number of your driver:   family Special instructions:  None  Please read over the following fact sheets that you were given. Anesthesia Post-op Instructions and Care and Recovery After Surgery      Hysteroscopy Hysteroscopy is a procedure used for looking inside the womb (uterus). It may be done for various reasons, including:  To evaluate abnormal bleeding, fibroid (benign, noncancerous) tumors, polyps, scar tissue (adhesions), and possibly cancer of the uterus.  To look for lumps (tumors) and other uterine growths.  To look for causes of why a woman cannot get pregnant (infertility), causes of recurrent loss of pregnancy (miscarriages), or a lost intrauterine device (IUD).  To perform a sterilization by blocking the fallopian tubes from inside the uterus.  In this procedure, a thin, flexible tube with a tiny light and camera on the end of it (hysteroscope) is used to look  inside the uterus. A hysteroscopy should be done right after a menstrual period to be sure you are not pregnant. LET [facility] CARE PROVIDER KNOW ABOUT:  Any allergies you have.  All medicines you are taking, including vitamins, herbs, eye drops, creams, and over-the-counter medicines.  Previous problems you or members of your family have had with the use of anesthetics.  Any blood disorders you have.  Previous surgeries you have had.  Medical conditions you have. RISKS AND COMPLICATIONS Generally, this is a safe procedure. However, as with any procedure, complications can occur. Possible complications include:  Putting a hole in the uterus.  Excessive bleeding.  Infection.  Damage to the cervix.  Injury to other organs.  Allergic reaction to medicines.  Too much fluid used in the uterus for the procedure.  BEFORE THE PROCEDURE  Ask your health care provider about changing or stopping any regular medicines.  Do not take aspirin or blood thinners for 1 week before the procedure, or as directed by your health care provider. These can cause bleeding.  If you smoke, do not smoke for 2 weeks before the procedure.  In some cases, a medicine is placed in the cervix the day before the procedure. This medicine makes the cervix have a larger opening (dilate). This makes it easier for the instrument to be inserted into the uterus during the procedure.  Do not eat or drink anything for  at least 8 hours before the surgery.  Arrange for someone to take you home after the procedure. PROCEDURE  You may be given a medicine to relax you (sedative). You may also be given one of the following: ? A medicine that numbs the area around the cervix (local anesthetic). ? A medicine that makes you sleep through the procedure (general anesthetic).  The hysteroscope is inserted through the vagina into the uterus. The camera on the hysteroscope sends a picture to a TV screen. This gives the  surgeon a good view inside the uterus.  During the procedure, air or a liquid is put into the uterus, which allows the surgeon to see better.  Sometimes, tissue is gently scraped from inside the uterus. These tissue samples are sent to a lab for testing. What to expect after the procedure  If you had a general anesthetic, you may be groggy for a couple hours after the procedure.  If you had a local anesthetic, you will be able to go home as soon as you are stable and feel ready.  You may have some cramping. This normally lasts for a couple days.  You may have bleeding, which varies from light spotting for a few days to menstrual-like bleeding for 3-7 days. This is normal.  If your test results are not back during the visit, make an appointment with your health care provider to find out the results. This information is not intended to replace advice given to you by your health care provider. Make sure you discuss any questions you have with your health care provider. Document Released: 07/20/2000 Document Revised: 09/19/2015 Document Reviewed: 11/10/2012 Elsevier Interactive Patient Education  2017 [place].  Dilation and Curettage or Vacuum Curettage Dilation and curettage (D&C) and vacuum curettage are minor procedures. A D&C involves stretching (dilation) the cervix and scraping (curettage) the inside lining of the uterus (endometrium). During a D&C, tissue is gently scraped from the endometrium, starting from the top portion of the uterus down to the lowest part of the uterus (cervix). During a vacuum curettage, the lining and tissue in the uterus are removed with the use of gentle suction. Curettage may be performed to either diagnose or treat a problem. As a diagnostic procedure, curettage is performed to examine tissues from the uterus. A diagnostic curettage may be done if you have:  Irregular bleeding in the uterus.  Bleeding with the development of clots.  Spotting between  menstrual periods.  Prolonged menstrual periods or other abnormal bleeding.  Bleeding after menopause.  No menstrual period (amenorrhea).  A change in size and shape of the uterus.  Abnormal endometrial cells discovered during a Pap test.  As a treatment procedure, curettage may be performed for the following reasons:  Removal of an IUD (intrauterine device).  Removal of retained placenta after giving birth.  Abortion.  Miscarriage.  Removal of endometrial polyps.  Removal of uncommon types of noncancerous lumps (fibroids).  Tell a health care provider about:  Any allergies you have, including allergies to prescribed medicine or latex.  All medicines you are taking, including vitamins, herbs, eye drops, creams, and over-the-counter medicines. This is especially important if you take any blood-thinning medicine. Bring a list of all of your medicines to your appointment.  Any problems you or family members have had with anesthetic medicines.  Any blood disorders you have.  Any surgeries you have had.  Your medical history and any medical conditions you have.  Whether you are pregnant or may  be pregnant.  Recent vaginal infections you have had.  Recent menstrual periods, bleeding problems you have had, and what form of birth control (contraception) you use. What are the risks? Generally, this is a safe procedure. However, problems may occur, including:  Infection.  Heavy vaginal bleeding.  Allergic reactions to medicines.  Damage to the cervix or other structures or organs.  Development of scar tissue (adhesions) inside the uterus, which can cause abnormal amounts of menstrual bleeding. This may make it harder to get pregnant in the future.  A hole (perforation) or puncture in the uterine wall. This is rare.  What happens before the procedure? Staying hydrated Follow instructions from your health care provider about hydration, which may include:  Up to 2  hours before the procedure - you may continue to drink clear liquids, such as water, clear fruit juice, black coffee, and plain tea.  Eating and drinking restrictions Follow instructions from your health care provider about eating and drinking, which may include:  8 hours before the procedure - stop eating heavy meals or foods such as meat, fried foods, or fatty foods.  6 hours before the procedure - stop eating light meals or foods, such as toast or cereal.  6 hours before the procedure - stop drinking milk or drinks that contain milk.  2 hours before the procedure - stop drinking clear liquids. If your health care provider told you to take your medicine(s) on the day of your procedure, take them with only a sip of water.  Medicines  Ask your health care provider about: ? Changing or stopping your regular medicines. This is especially important if you are taking diabetes medicines or blood thinners. ? Taking medicines such as aspirin and ibuprofen. These medicines can thin your blood. Do not take these medicines before your procedure if your health care provider instructs you not to.  You may be given antibiotic medicine to help prevent infection. General instructions  For 24 hours before your procedure, do not: ? Douche. ? Use tampons. ? Use medicines, creams, or suppositories in the vagina. ? Have sexual intercourse.  You may be given a pregnancy test on the day of the procedure.  Plan to have someone take you home from the hospital or clinic.  You may have a blood or urine sample taken.  If you will be going home right after the procedure, plan to have someone with you for 24 hours. What happens during the procedure?  To reduce your risk of infection: ? Your health care team will wash or sanitize their hands. ? Your skin will be washed with soap.  An IV tube will be inserted into one of your veins.  You will be given one of the following: ? A medicine that numbs the  area in and around the cervix (local anesthetic). ? A medicine to make you fall asleep (general anesthetic).  You will lie down on your back, with your feet in foot rests (stirrups).  The size and position of your uterus will be checked.  A lubricated instrument (speculum or Sims retractor) will be inserted into the back side of your vagina. The speculum will be used to hold apart the walls of your vagina so your health care provider can see your cervix.  A tool (tenaculum) will be attached to the lip of the cervix to stabilize it.  Your cervix will be softened and dilated. This may be done by: ? Taking a medicine. ? Having tapered dilators or thin  rods (laminaria) or gradual widening instruments (tapered dilators) inserted into your cervix.  A small, sharp, curved instrument (curette) will be used to scrape a small amount of tissue or cells from the endometrium or cervical canal. In some cases, gentle suction is applied with the curette. The curette will then be removed. The cells will be taken to a lab for testing. The procedure may vary among health care providers and hospitals. What happens after the procedure?  You may have mild cramping, backache, pain, and light bleeding or spotting. You may pass small blood clots from your vagina.  You may have to wear compression stockings. These stockings help to prevent blood clots and reduce swelling in your legs.  Your blood pressure, heart rate, breathing rate, and blood oxygen level will be monitored until the medicines you were given have worn off. Summary  Dilation and curettage (D&C) involves stretching (dilation) the cervix and scraping (curettage) the inside lining of the uterus (endometrium).  After the procedure, you may have mild cramping, backache, pain, and light bleeding or spotting. You may pass small blood clots from your vagina.  Plan to have someone take you home from the hospital or clinic. This information is not  intended to replace advice given to you by your health care provider. Make sure you discuss any questions you have with your health care provider. Document Released: 04/13/2005 Document Revised: 12/29/2015 Document Reviewed: 12/29/2015 Elsevier Interactive Patient Education  2018 [employer].  Dilation and Curettage or Vacuum Curettage, Care After These instructions give you information about caring for yourself after your procedure. Your doctor may also give you more specific instructions. Call your doctor if you have any problems or questions after your procedure. Follow these instructions at home: Activity  Do not drive or use heavy machinery while taking prescription pain medicine.  For 24 hours after your procedure, avoid driving.  Take short walks often, followed by rest periods. Ask your doctor what activities are safe for you. After one or two days, you may be able to return to your normal activities.  Do not lift anything that is heavier than 10 lb (4.5 kg) until your doctor approves.  For at least 2 weeks, or as long as told by your doctor: ? Do not douche. ? Do not use tampons. ? Do not have sex. General instructions  Take over-the-counter and prescription medicines only as told by your doctor. This is very important if you take blood thinning medicine.  Do not take baths, swim, or use a hot tub until your doctor approves. Take showers instead of baths.  Wear compression stockings as told by your doctor.  It is up to you to get the results of your procedure. Ask your doctor when your results will be ready.  Keep all follow-up visits as told by your doctor. This is important. Contact a doctor if:  You have very bad cramps that get worse or do not get better with medicine.  You have very bad pain in your belly (abdomen).  You cannot drink fluids without throwing up (vomiting).  You get pain in a different part of the area between your belly and thighs  (pelvis).  You have bad-smelling discharge from your vagina.  You have a rash. Get help right away if:  You are bleeding a lot from your vagina. A lot of bleeding means soaking more than one sanitary pad in an hour, for 2 hours in a row.  You have clumps of blood (blood clots)  coming from your vagina.  You have a fever or chills.  Your belly feels very tender or hard.  You have chest pain.  You have trouble breathing.  You cough up blood.  You feel dizzy.  You feel light-headed.  You pass out (faint).  You have pain in your neck or shoulder area. Summary  Take short walks often, followed by rest periods. Ask your doctor what activities are safe for you. After one or two days, you may be able to return to your normal activities.  Do not lift anything that is heavier than 10 lb (4.5 kg) until your doctor approves.  Do not take baths, swim, or use a hot tub until your doctor approves. Take showers instead of baths.  Contact your doctor if you have any symptoms of infection, like bad-smelling discharge from your vagina. This information is not intended to replace advice given to you by your health care provider. Make sure you discuss any questions you have with your health care provider. Document Released: 01/21/2008 Document Revised: 12/30/2015 Document Reviewed: 12/30/2015 Elsevier Interactive Patient Education  2017 [place].  Endometrial Ablation Endometrial ablation is a procedure that destroys the thin inner layer of the lining of the uterus (endometrium). This procedure may be done:  To stop heavy periods.  To stop bleeding that is causing anemia.  To control irregular bleeding.  To treat bleeding caused by small tumors (fibroids) in the endometrium.  This procedure is often an alternative to major surgery, such as removal of the uterus and cervix (hysterectomy). As a result of this procedure:  You may not be able to have children. However, if you are  premenopausal (you have not gone through menopause): ? You may still have a small chance of getting pregnant. ? You will need to use a reliable method of birth control after the procedure to prevent pregnancy.  You may stop having a menstrual period, or you may have only a small amount of bleeding during your period. Menstruation may return several years after the procedure.  Tell a health care provider about:  Any allergies you have.  All medicines you are taking, including vitamins, herbs, eye drops, creams, and over-the-counter medicines.  Any problems you or family members have had with the use of anesthetic medicines.  Any blood disorders you have.  Any surgeries you have had.  Any medical conditions you have. What are the risks? Generally, this is a safe procedure. However, problems may occur, including:  A hole (perforation) in the uterus or bowel.  Infection of the uterus, bladder, or vagina.  Bleeding.  Damage to other structures or organs.  An air bubble in the lung (air embolus).  Problems with pregnancy after the procedure.  Failure of the procedure.  Decreased ability to diagnose cancer in the endometrium.  What happens before the procedure?  You will have tests of your endometrium to make sure there are no pre-cancerous cells or cancer cells present.  You may have an ultrasound of the uterus.  You may be given medicines to thin the endometrium.  Ask your health care provider about: ? Changing or stopping your regular medicines. This is especially important if you take diabetes medicines or blood thinners. ? Taking medicines such as aspirin and ibuprofen. These medicines can thin your blood. Do not take these medicines before your procedure if your doctor tells you not to.  Plan to have someone take you home from the hospital or clinic. What happens during the procedure?  You will lie on an exam table with your feet and legs supported as in a pelvic  exam.  To lower your risk of infection: ? Your health care team will wash or sanitize their hands and put on germ-free (sterile) gloves. ? Your genital area will be washed with soap.  An IV tube will be inserted into one of your veins.  You will be given a medicine to help you relax (sedative).  A surgical instrument with a light and camera (resectoscope) will be inserted into your vagina and moved into your uterus. This allows your surgeon to see inside your uterus.  Endometrial tissue will be removed using one of the following methods: ? Radiofrequency. This method uses a radiofrequency-alternating electric current to remove the endometrium. ? Cryotherapy. This method uses extreme cold to freeze the endometrium. ? Heated-free liquid. This method uses a heated saltwater (saline) solution to remove the endometrium. ? Microwave. This method uses high-energy microwaves to heat up the endometrium and remove it. ? Thermal balloon. This method involves inserting a catheter with a balloon tip into the uterus. The balloon tip is filled with heated fluid to remove the endometrium. The procedure may vary among health care providers and hospitals. What happens after the procedure?  Your blood pressure, heart rate, breathing rate, and blood oxygen level will be monitored until the medicines you were given have worn off.  As tissue healing occurs, you may notice vaginal bleeding for 4-6 weeks after the procedure. You may also experience: ? Cramps. ? Thin, watery vaginal discharge that is light pink or brown in color. ? A need to urinate more frequently than usual. ? Nausea.  Do not drive for 24 hours if you were given a sedative.  Do not have sex or insert anything into your vagina until your health care provider approves. Summary  Endometrial ablation is done to treat the many causes of heavy menstrual bleeding.  The procedure may be done only after medications have been tried to control the  bleeding.  Plan to have someone take you home from the hospital or clinic. This information is not intended to replace advice given to you by your health care provider. Make sure you discuss any questions you have with your health care provider. Document Released: 02/21/2004 Document Revised: 04/30/2016 Document Reviewed: 04/30/2016 Elsevier Interactive Patient Education  2017 [place] Anesthesia, Adult General anesthesia is the use of medicines to make a person "go to sleep" (be unconscious) for a medical procedure. General anesthesia is often recommended when a procedure:  Is long.  Requires you to be still or in an unusual position.  Is major and can cause you to lose blood.  Is impossible to do without general anesthesia.  The medicines used for general anesthesia are called general anesthetics. In addition to making you sleep, the medicines:  Prevent pain.  Control your blood pressure.  Relax your muscles.  Tell a health care provider about:  Any allergies you have.  All medicines you are taking, including vitamins, herbs, eye drops, creams, and over-the-counter medicines.  Any problems you or family members have had with anesthetic medicines.  Types of anesthetics you have had in the past.  Any bleeding disorders you have.  Any surgeries you have had.  Any medical conditions you have.  Any history of heart or lung conditions, such as heart failure, sleep apnea, or chronic obstructive pulmonary disease (COPD).  Whether you are pregnant or may be pregnant.  Whether you  use tobacco, alcohol, marijuana, or street drugs.  Any history of Armed forces logistics/support/administrative officer.  Any history of depression or anxiety. What are the risks? Generally, this is a safe procedure. However, problems may occur, including:  Allergic reaction to anesthetics.  Lung and heart problems.  Inhaling food or liquids from your stomach into your lungs (aspiration).  Injury to  nerves.  Waking up during your procedure and being unable to move (rare).  Extreme agitation or a state of mental confusion (delirium) when you wake up from the anesthetic.  Air in the bloodstream, which can lead to stroke.  These problems are more likely to develop if you are having a major surgery or if you have an advanced medical condition. You can prevent some of these complications by answering all of your health care provider's questions thoroughly and by following all pre-procedure instructions. General anesthesia can cause side effects, including:  Nausea or vomiting  A sore throat from the breathing tube.  Feeling cold or shivery.  Feeling tired, washed out, or achy.  Sleepiness or drowsiness.  Confusion or agitation.  What happens before the procedure? Staying hydrated Follow instructions from your health care provider about hydration, which may include:  Up to 2 hours before the procedure - you may continue to drink clear liquids, such as water, clear fruit juice, black coffee, and plain tea.  Eating and drinking restrictions Follow instructions from your health care provider about eating and drinking, which may include:  8 hours before the procedure - stop eating heavy meals or foods such as meat, fried foods, or fatty foods.  6 hours before the procedure - stop eating light meals or foods, such as toast or cereal.  6 hours before the procedure - stop drinking milk or drinks that contain milk.  2 hours before the procedure - stop drinking clear liquids.  Medicines  Ask your health care provider about: ? Changing or stopping your regular medicines. This is especially important if you are taking diabetes medicines or blood thinners. ? Taking medicines such as aspirin and ibuprofen. These medicines can thin your blood. Do not take these medicines before your procedure if your health care provider instructs you not to. ? Taking new dietary supplements or  medicines. Do not take these during the week before your procedure unless your health care provider approves them.  If you are told to take a medicine or to continue taking a medicine on the day of the procedure, take the medicine with sips of water. General instructions   Ask if you will be going home the same day, the following day, or after a longer hospital stay. ? Plan to have someone take you home. ? Plan to have someone stay with you for the first 24 hours after you leave the hospital or clinic.  For 3-6 weeks before the procedure, try not to use any tobacco products, such as cigarettes, chewing tobacco, and e-cigarettes.  You may brush your teeth on the morning of the procedure, but make sure to spit out the toothpaste. What happens during the procedure?  You will be given anesthetics through a mask and through an IV tube in one of your veins.  You may receive medicine to help you relax (sedative).  As soon as you are asleep, a breathing tube may be used to help you breathe.  An anesthesia specialist will stay with you throughout the procedure. He or she will help keep you comfortable and safe by continuing to give you medicines  and adjusting the amount of medicine that you get. He or she will also watch your blood pressure, pulse, and oxygen levels to make sure that the anesthetics do not cause any problems.  If a breathing tube was used to help you breathe, it will be removed before you wake up. The procedure may vary among health care providers and hospitals. What happens after the procedure?  You will wake up, often slowly, after the procedure is complete, usually in a recovery area.  Your blood pressure, heart rate, breathing rate, and blood oxygen level will be monitored until the medicines you were given have worn off.  You may be given medicine to help you calm down if you feel anxious or agitated.  If you will be going home the same day, your health care provider may  check to make sure you can stand, drink, and urinate.  Your health care providers will treat your pain and side effects before you go home.  Do not drive for 24 hours if you received a sedative.  You may: ? Feel nauseous and vomit. ? Have a sore throat. ? Have mental slowness. ? Feel cold or shivery. ? Feel sleepy. ? Feel tired. ? Feel sore or achy, even in parts of your body where you did not have surgery. This information is not intended to replace advice given to you by your health care provider. Make sure you discuss any questions you have with your health care provider. Document Released: 07/21/2007 Document Revised: 09/24/2015 Document Reviewed: 03/28/2015 Elsevier Interactive Patient Education  2018 [place] Anesthesia, Adult, Care After These instructions provide you with information about caring for yourself after your procedure. Your health care provider may also give you more specific instructions. Your treatment has been planned according to current medical practices, but problems sometimes occur. Call your health care provider if you have any problems or questions after your procedure. What can I expect after the procedure? After the procedure, it is common to have:  Vomiting.  A sore throat.  Mental slowness.  It is common to feel:  Nauseous.  Cold or shivery.  Sleepy.  Tired.  Sore or achy, even in parts of your body where you did not have surgery.  Follow these instructions at home: For at least 24 hours after the procedure:  Do not: ? Participate in activities where you could fall or become injured. ? Drive. ? Use heavy machinery. ? Drink alcohol. ? Take sleeping pills or medicines that cause drowsiness. ? Make important decisions or sign legal documents. ? Take care of children on your own.  Rest. Eating and drinking  If you vomit, drink water, juice, or soup when you can drink without vomiting.  Drink enough fluid to keep your  urine clear or pale yellow.  Make sure you have little or no nausea before eating solid foods.  Follow the diet recommended by your health care provider. General instructions  Have a responsible adult stay with you until you are awake and alert.  Return to your normal activities as told by your health care provider. Ask your health care provider what activities are safe for you.  Take over-the-counter and prescription medicines only as told by your health care provider.  If you smoke, do not smoke without supervision.  Keep all follow-up visits as told by your health care provider. This is important. Contact a health care provider if:  You continue to have nausea or vomiting at home, and medicines are not  helpful.  You cannot drink fluids or start eating again.  You cannot urinate after 8-12 hours.  You develop a skin rash.  You have fever.  You have increasing redness at the site of your procedure. Get help right away if:  You have difficulty breathing.  You have chest pain.  You have unexpected bleeding.  You feel that you are having a life-threatening or urgent problem. This information is not intended to replace advice given to you by your health care provider. Make sure you discuss any questions you have with your health care provider. Document Released: 07/20/2000 Document Revised: 09/16/2015 Document Reviewed: 03/28/2015 Elsevier Interactive Patient Education  2018 [place] de la Ciruga   Su ciruga est programada para-(your procedure is scheduled on)  05/25/2017    [place] - (enter)    Por favor llame al [phone] si tiene algn problema en la maana de la ciruga. (please call if you have any problems the morning of surgery.)                  Recuerde: (Remember)   No coma alimentos ni tome lquidos, incluyendo agua, despus de la medianoche  del  (Do not eat food or drink liquids including water after midnight on 05/24/2017.   Tome estas medicinas en la maana de la ciruga con un SORBITO de agua (take these meds the morning of surgery with a SIP of water)  Celexa and prilosec.   Puede cepillarse los dientes en la maana de la [place]. (you may brush your teeth the morning of surgery)   No use joyas, maquillaje de ojos, lpiz labial, crema para el cuerpo o esmalte de uas oscuro. (Do not wear jewelry, eye makeup, lipstick, body lotion, or dark fingernail polish)   No puede usar desodorante. (you may wear deodorant)   Si va a ser ingresado despues de la ciruga, deje la maleta en el carro hasta que se le haya asignado una habitacin. (If you are to be admitted after surgery, leave suitcase in car until your room has been assigned.)   A los pacientes que se les d de alta el mismo da no se les permitir manejar a casa.  (Patients discharged on the day of surgery will not be allowed to drive home)   Use ropa suelta y cmoda de regreso a casa. (wear loose comfortable clothes for ride home)    Histeroscopa (Hysteroscopy) La histeroscopa es un procedimiento que se [place] para observar el interior de la matriz (tero) Puede indicarse por diferentes motivos, entre ellos:  Para evaluar una hemorragia anormal, un fibroma (tumor benigno, no canceroso), tumores, plipos o tejido cicatrizal (adherencias) y la posibilidad de cncer de tero.  Para detectar bultos (tumores) y otros crecimientos anormales uterinos.  Para buscar las causas por las que una mujer no queda embarazada (infertilidad) causas recurrentes de prdida de embarazo (abortos espontneos) o por la prdida de un dispositivo intrauterino (DIU).  Para realizar un procedimiento de esterilizacin cerrando las trompas de [place] desde adentro del tero. En este procedimiento, se coloca un tubo delgado y luminoso, con una cmara en el extremo (histeroscopio)para observar el  interior del tero. La histeroscopa se realiza inmediatamente despus del perodo menstrual para asegurarse de que no existe embarazo. INFORME A SU MDICO:  Cualquier alergia que tenga.  Todos los [employer] [place], incluyendo vitaminas, hierbas, gotas oftlmicas, cremas y medicamentos de venta libre.  Problemas previos que usted o los [employer] de su familia hayan tenido con el uso de anestsicos.  Enfermedades de [employer].  Cirugas previas.  Padecimientos mdicos.  RIESGOS Y COMPLICACIONES Generalmente es un procedimiento seguro. Sin embargo, Games developer procedimiento, pueden surgir complicaciones. Las complicaciones posibles son:  Perforacin del tero.  Sangrado excesivo.  Infeccin.  Lesin en el cuello del tero.  Lesiones en otros rganos.  Reaccin alrgica a un medicamento.  Inoculacin de lquido en exceso dentro del tero. ANTES DEL PROCEDIMIENTO  Consulte a su mdico si debe cambiar o suspender los medicamentos que toma habitualmente.  No tome aspirina ni anticoagulantes durante la semana previa al procedimiento, o segn le hayan indicado. Pueden ocasionar hemorragias.  Si fuma, no lo haga durante las [employer] al procedimiento.  En algunos casos, el da anterior al procedimiento se coloca un medicamento en el cuello del tero. Este medicamento dilata el cuello y [place] la abertura. Esto facilita la insercin del instrumento en el tero durante el procedimiento.  No debe comer ni beber nada durante al menos 8 horas antes de la [place].  Pdale a alguna persona que la lleve a su casa luego del procedimiento.  PROCEDIMIENTO  Le administrarn un medicamento para relajarse (sedante). Tambin podrn administrarle uno de los siguientes medicamentos: ? Medicamentos que adormecen el rea del cuello uterino (anestesia local). ? Un medicamento para que duerma durante el procedimiento (anestesia genera).  El histeroscopio se inserta a  travs de la vagina, dentro del tero. La cmara del laparoscopio enva una imagen a una pantalla de televisin que se encuentra en el quirfano. [employer] modo, el mdico tendr Ardelia Mems buena visin del interior del tero.  Durante el Wellsite geologist un lquido o aire en el interior de [place], lo que le permitir al cirujano observar mejor.  En algunas ocasiones, el tejido del interior del tero se legra suavemente. Esas muestras de tejido se envan al laboratorio para ser examinadas.  DESPUS DEL PROCEDIMIENTO  Si le han administrado anestesia general podr sentirse atontada durante algunas horas despus del procedimiento.  Si se utiliza Tour manager, podr volver a su casa tan pronto como est estable y se sienta en condiciones.  Podr sentir clicos leves. Es normal que esto dure un par [employer].  Podr tener una hemorragia, la que puede variar entre una pequea mancha durante algunos das hasta una hemorragia similar a Mining engineer durante 3-7 das. Esto es normal.  Si el resultado de los estudios no estn listos durante la visita, tenga otra entrevista con su mdico para conocerlos.  Esta informacin no tiene Marine scientist el consejo del mdico. Asegrese de hacerle al mdico cualquier pregunta que tenga. Document Released: 04/13/2005 Document Revised: 02/01/2013 Document Reviewed: 11/10/2012 Elsevier Interactive Patient Education  2017 [place] y curetaje o curetaje por aspiracin (Dilation and Curettage or Vacuum Curettage) La dilatacin y el curetaje por aspiracin son procedimientos menores. Consiste en la apertura (dilatacin) del cuello del tero y el raspado (curetaje) en la superficie interna del tero. Durante este procedimiento, el tejido del interior del tero se raspa suavemente. Durante el curetaje por aspiracin, se utiliza una succin suave para extirpar tejidos del tero.  El curetaje  podr realizarse para diagnstico o para tratar un  problema. Como procedimiento diagnstico, se realiza para examinar los tejidos del tero. Un diagnstico con curetaje se realiza en caso de presentarse los siguientes sntomas:   Sangrado irregular en el tero.  Hemorragias con cogulos.  Prdida de Microsoft perodos [place].  Perodos menstruales prolongados.  Sangrado luego de la menopausia.  Falta de periodo menstrual (amenorrea).  Cambio en el tamao y forma del tero. [employer], el curetaje podr realizarse por las siguientes causas:   Remocin del DIU (dispositivo intrauterino).  Remocin de placenta retenida luego del parto. La placenta retenida puede causar una hemorragia lo suficientemente grave como para requerir transfusiones o Astronomer una infeccin.  Aborto.  Aborto espontneo.  Extirpacin de plipos en el interior del tero.  Extirpacin de fibromas no frecuentes (bultos no cancerosos). INFORME A SU MDICO:   Cualquier alergia que tenga.  Todos los [employer] Corporation [place], incluyendo vitaminas, hierbas, gotas oftlmicas, cremas y medicamentos de venta libre.  Problemas previos que usted o los [employer] de su familia hayan tenido con el uso de anestsicos.  Enfermedades de [employer].  Cirugas previas.  Padecimientos mdicos. RIESGOS Y COMPLICACIONES  Generalmente es un procedimiento seguro. Sin embargo, Games developer procedimiento, pueden surgir complicaciones. Las complicaciones posibles son:  Elvina Mattes.  Infeccin en el tero.  Lesin en el cuello del tero.  Desarrollo de tejido Pensions consultant (adherencias) dentro del tero que posteriormente causan hemorragia menstrual en cantidad anormal.  Complicaciones de la anestesia general, si se ha usado.  Perforacin del tero. Esto es raro. ANTES DEL PROCEDIMIENTO   Coma y beba antes del procedimiento slo lo que le indique el mdico.  Arregle con alguna persona para que la lleve a su casa. PROCEDIMIENTO  El  procedimiento demora entre 15 y 79 minutos.  Podrn administrarle uno de los siguientes medicamentos: ? Medicamentos que adormecen el rea del cuello uterino (anestesia local). ? Un medicamento para que duerma durante el procedimiento (anestesia general).  Deber recostarse sobre la espalda con las piernas en los estribos.  Le colocarn en la vagina un instrumento metlico o plstico entibiado espculo) para [place] y permitir al profesional visualizar el cuello del tero.  [employer] formas en las que el cuello del tero puede ser ablandado y dilatado. Pueden ser: ? Tomar medicamentos. ? Insercin de unas varillas delgadas (laminarias) en el cuello del tero.  Se utilizar un instrumento curvo (cureta)para raspar las clulas de la membrana que cubre el interior del tero. En algunos casos, se aplica una succin suave con la cureta. Luego la cureta se Charity fundraiser. DESPUS DEL PROCEDIMIENTO   Descansar en una sala de recuperacin hasta que se sienta estable y lista para volver a su casa.  Podr tener nuseas o vmitos si le han administrado anestesia general.  Education officer, environmental de garganta si le han colocado un tubo durante la anestesia general.  Podr sentir algunos clicos y Best boy una pequea hemorragia. Estas molestias pueden durar entre 2 das y 2 semanas despus del procedimiento.  Luego del procedimiento, el tero formar [place] Esto puede hacer que el prximo perodo se retrase. Esta informacin no tiene Marine scientist el consejo del mdico. Asegrese de hacerle al mdico cualquier pregunta que tenga. Document Released: 09/30/2007 Document Revised: 12/14/2012 Elsevier Interactive Patient Education  2017 [place] y curetaje o curetaje por aspiracin, cuidados posteriores (Dilation and Curettage or Vacuum Curettage, Care After) Estas indicaciones le proporcionan informacin acerca de cmo  deber cuidarse despus del procedimiento. El mdico  tambin podr darle instrucciones especficas. Comunquese con el mdico si tiene algn problema o tiene preguntas despus del procedimiento. CUIDADOS EN EL HOGAR  No conduzca durante 24horas.  Espere 1 semana antes de realizar [employer] la [employer].  [place] (2) veces al da, durante 4 [place]. Antelas. Comunquese con su mdico si tiene fiebre.  No permanezca de pie durante mucho tiempo.  No levante, empuje ni jale objetos de ms de 10 libras (4,5 kilogramos).  Solo suba escaleras una o dos veces por da.  Descanse con frecuencia.  Siga con su dieta habitual.  Beba suficiente lquido para mantener el pis (orina) claro o de color amarillo plido.  Si tiene dificultad para Administrator, sports (estreimiento), puede hacer lo siguiente: ? Tome algn medicamento que la ayude a Film/video editor intestino (laxante) segn las indicaciones de su mdico. ? Consuma ms fruta y salvado. ? Beba ms lquidos.  [employer] ducha, no un bao de inmersin, durante el tiempo que le indique su mdico.  No practique natacin ni use el jacuzzi hasta que el mdico la autorice.  Pdale a alguien que se quede con usted durante 1 o 2das despus del procedimiento.  No se haga duchas vaginales, no use tampones ni tenga sexo (relaciones sexuales) durante 2 semanas.  Solo tome los [employer] Corporation le haya indicado su mdico. No tome aspirina. Puede ocasionar hemorragias.  Cumpla con los controles mdicos.  SOLICITE AYUDA SI:  Tiene clicos o siente un dolor que no puede controlar con los medicamentos.  Siente dolor en el vientre (abdomen).  Advierte un olor ftido que proviene de la vagina.  Tiene una erupcin cutnea.  Tiene problemas con los medicamentos.  SOLICITE AYUDA DE INMEDIATO SI:  Tiene una hemorragia ms abundante que un perodo normal.  Tiene fiebre.  Siente dolor en el pecho.  Tiene dificultad para respirar.  Se siente mareada o siente como si fuera a  desmayarse (vahdo).  Se desmaya.  Siente dolor en la zona superior de los hombros.  Tiene una hemorragia vaginal, con o sin grumos de sangre (cogulos sanguneos).  ASEGRESE DE QUE:  Comprende estas instrucciones.  Controlar su afeccin.  Recibir ayuda de inmediato si no mejora o si empeora.  Esta informacin no tiene Marine scientist el consejo del mdico. Asegrese de hacerle al mdico cualquier pregunta que tenga. Document Released: 12/10/2010 Document Revised: 04/18/2013 Document Reviewed: 11/10/2012 Elsevier Interactive Patient Education  2017 [place] endometrio (Endometrial Ablation) En la ablacin del endometrio se extirpa el recubrimiento interno del tero (endometrio). Este es generalmente un tratamiento que se Scientist, water quality, en forma ambulatoria. La ablacin permite evitar una ciruga mayor, como la ciruga para extirpar el cuello del tero y Nurse, learning disability (histerectoma). Luego de la ablacin del endometrio, tendr poco o nada de flujo menstrual y no podr tener hijos. Sin embargo, si se encuentra en la etapa de la premenopausia, deber usar un mtodo confiable de control de la natalidad luego del procedimiento, ya que hay una pequea probabilidad de que ocurra un Media planner. Hay diferentes razones para llevar a cabo este procedimiento, ellas son:  Perodos abundantes.  Sangrado que causa anemia.  Sangrado irregular.  Fibromas que sangran en la superficie interior del tero, si no miden ms de 3 cm. Tal vez no sea posible realizarle este procedimiento si:  Desea tener hijos en el futuro.  Tiene clicos intensos durante el perodo menstrual.  Tiene  clulas precancerosas o cancerosas en el tero.  Tuvo un embarazo reciente.  Est cursando la menopausia.  Se someti a [place] mayor de tero, la cual le produjo el afinamiento de la pared [place]. Las cirugas pueden incluir lo siguiente: ? La extirpacin de uno o ms fibromas uterinos  (miomectoma). ? Ardelia Mems cesrea con una incisin clsica (vertical) en el tero. Pregntele al mdico qu tipo de Pensions consultant. En ocasiones, la Arts development officer piel es diferente de la que se forma en el tero. Aunque se haya sometido a una ciruga uterina, algunos tipos de ablacin an pueden ser seguros para su caso. Hable con el mdico. INFORME A SU MDICO:  Cualquier alergia que tenga.  Todos los [employer] [place], incluyendo vitaminas, hierbas, gotas oftlmicas, cremas y medicamentos de venta libre.  Problemas previos que usted o los [employer] de su familia hayan tenido con el uso de anestsicos.  Enfermedades de Campbell Soup.  Cirugas previas.  Padecimientos mdicos.  RIESGOS Y COMPLICACIONES Generalmente, este es un procedimiento seguro. Sin embargo, Games developer procedimiento, pueden surgir complicaciones. Las complicaciones posibles son:  Perforacin del tero.  Hemorragias.  Infeccin en el tero, vejiga, o vagina.  Lesin en los rganos circundantes.  Ardelia Mems burbuja de aire en un pulmn (embolia de aire).  Embarazo luego del procedimiento.  Si el procedimiento no resuelve el problema, ser necesaria una histerectoma.  Disminucin de la capacidad para Community education officer en el recubrimiento interno del tero. ANTES DEL PROCEDIMIENTO  Deber controlarse la membrana que recubre el tero para asegurarse de que no hay clulas cancerosas o precancerosas.  Le harn una ecografa para observar el tamao del tero y verificar si hay anormalidades.  Le darn medicamentos para Engineer, structural de la membrana que recubre el tero.  PROCEDIMIENTO Durante el procedimiento, el medico usar un instrumento llamado resectoscopio para ver el interior del tero. Hay diferentes formas de extirpar la membrana que cubre el tero.  Radiofrecuencia: en este mtodo se utiliza un aparato de radiofrecuencia, corriente elctrica alterna, para extirpar la membrana interna  del tero.  Crioterapia: en este mtodo se [place] fro extremo para congelar la membrana del tero.  Lquido caliente: en este mtodo se utiliza solucin salina con el mismo objetivo.  Microondas: Se utilizan microondas de alta energa para calentar la membrana y extirparla.  Baln termal: consiste en insertar un catter con un baln en la punta dentro del tero. La punta del baln contiene un lquido caliente que elimina la membrana que tapiza el [place]. DESPUS DEL PROCEDIMIENTO Despus del procedimiento, no tenga relaciones sexuales ni inserte nada en la vagina hasta que su mdico la autorice. Despus del procedimiento podr experimentar:  Clicos.  Flujo vaginal.  Ganas de orinar con frecuencia. Esta informacin no tiene Marine scientist el consejo del mdico. Asegrese de hacerle al mdico cualquier pregunta que tenga. Document Released: 12/14/2012 Document Revised: 01/02/2015 Document Reviewed: 09/14/2012 Elsevier Interactive Patient Education  2017 [place] general en los adultos (General Anesthesia, Adult) La anestesia general es el uso de medicamentos para dormir a Furniture conservator/restorer persona (dejarla inconsciente) para una intervencin [place]. La anestesia general suele recomendarse cuando una ciruga:  Va a durar [employer].  Requiere que se quede quieto o que est en una posicin inusual.  Es mayor y puede provocar que pierda [place].  Es imposible de Optometrist sin anestesia general. Los medicamentos utilizados para la anestesia general se llaman anestsicos generales. Adems de mantenerlo dormido, estos medicamentos:  Evitan el  dolor.  Controlan la presin arterial.  Relajan los msculos. INFORME A SU MDICO:  Cualquier alergia que tenga.  Todos los [employer], incluidos vitaminas, hierbas, gotas oftlmicas, cremas y medicamentos de venta libre.  Cualquier problema que usted o sus familiares hayan tenido con la anestesia.  Tipos de  anestsicos que le hayan dado en el pasado.  Cualquier trastorno hemorrgico que tenga.  Cirugas previas.  Cualquier enfermedad que tenga.  Cualquier antecedente de afecciones cardacas o pulmonares, como insuficiencia cardaca, apnea del sueo o enfermedad pulmonar obstructiva crnica (EPOC).  Si est embarazada o podra estarlo.  Si consume tabaco, alcohol, marihuana o drogas.  Antecedentes de [employer].  Antecedentes de depresin o ansiedad. RIESGOS Y COMPLICACIONES En general, se trata de un procedimiento seguro. Sin embargo, pueden presentarse problemas, por ejemplo:  Reaccin alrgica a la anestesia.  Problemas cardacos o pulmonares.  Inhalar alimentos o lquidos del estmago a los pulmones (aspiracin).  Lesin en los nervios.  Despertarse durante la [place] y no poder moverse (raro).  Nerviosismo extremo o estado de confusin mental (delirio) al despertarse de la anestesia.  Aire en el torrente sanguneo, que puede provocar un ictus. Es ms probable que estos problemas surjan en caso de que le realicen una ciruga mayor o si tiene una enfermedad terminal. Puede evitar algunas de estas complicaciones respondiendo a todas las preguntas del mdico concienzudamente y siguiendo todas las indicaciones previas a la [place]. La anestesia general puede causar efectos secundarios, como por ejemplo:  Nuseas o vmitos  Dolor de garganta causado por el tubo endotraqueal.  Fro o escalofros.  Sentirse cansado, sin fuerzas o dolorido.  Sueo o somnolencia.  Confusin o nerviosismo. ANTES DEL PROCEDIMIENTO Mantenerse hidratado Siga las indicaciones del mdico acerca de la hidratacin, las cuales pueden incluir lo siguiente:  Hasta 2horas antes del procedimiento, puede beber lquidos transparentes, como agua, jugos frutales transparentes, caf negro y t solo. Restricciones en las comidas y bebidas [place] comidas y las  bebidas, las cuales pueden incluir lo siguiente:  Ocho horas antes del procedimiento, deje de ingerir comidas o alimentos pesados, por ejemplo, carne, alimentos fritos o alimentos grasos.  Seis horas antes del procedimiento, deje de ingerir comidas o alimentos livianos, como tostadas o cereales.  Seis horas antes del procedimiento, deje de beber [place] o bebidas que [employer].  Dos horas antes del procedimiento, deje de beber lquidos transparentes. Medicamentos  Consulte al mdico si debe hacer o no lo siguiente: ? Cambiar o suspender los medicamentos que toma habitualmente. Esto es muy importante si toma medicamentos para la diabetes o anticoagulantes. ? Tomar medicamentos como aspirina e ibuprofeno. Estos medicamentos pueden tener un efecto anticoagulante en la [place]. No tome estos medicamentos antes del procedimiento si el mdico le indica que no lo haga. ? Tomar complementos alimenticios o medicamentos nuevos. No tome estos medicamentos durante la semana anterior a la ciruga, a menos que el mdico lo autorice.  Si le indican que debe tomar un medicamento o que debe continuar tomando un medicamento el da de la [place], tmelo con sorbos de [place]. Instrucciones generales  Pregunte si volver a su [place], o si debe quedarse en el hospital por [employer]. ? Pdale a alguien que lo lleve a su casa. ? Pdale a alguien que se quede con usted durante las primeras 24horas despus de salir del hospital o de la clnica.  No consuma ningn producto que contenga tabaco,  como cigarrillos, tabaco de Higher education careers adviser y Psychologist, sport and exercise, durante al menos 3 a 6semanas antes de la [place].  Puede lavarse los dientes la maana de la [place], pero asegrese de escupir la pasta de dientes. PROCEDIMIENTO  Le aplicarn la anestesia con Judene Companion y por va intravenosa (IV).  Es posible que le den un medicamento para ayudarlo a Nurse, children's (sedante).  Una vez que est  dormido, es posible que le coloquen un tubo para ayudarlo a Ambulance person.  Un anestesista permanecer con usted durante toda la [place]. Continuar dndole los medicamentos que necesita y Secretary/administrator la dosis para mantenerlo cmodo y Engineer, maintenance. Tambin le controlarn la presin arterial, el pulso y los niveles de oxgeno para asegurarse de que no tenga ningn problema.  Si le colocaron un tubo para ayudarlo a Ambulance person, se lo quitarn antes de que se despierte. Este procedimiento puede variar segn el mdico y el hospital. DESPUS DEL PROCEDIMIENTO  Cuando termine la cir[place], por lo general, se despertar lentamente y en una sala de reanimacin.  Le controlarn la presin arterial, la frecuencia cardaca, la frecuencia respiratoria y Retail buyer de oxgeno en la sangre hasta que haya desaparecido el efecto de los medicamentos administrados.  Es posible que le den [employer] lo ayuden a calmarse si se siente nervioso o ansioso.  Si se ir a su casa el mismo da, el mdico verificar si puede pararse, beber y Garment/textile technologist.  El mdico tambin tratar Conservation officer, historic buildings y los efectos secundarios antes de que se vaya a su casa.  No conduzca durante 24horas si le administraron un sedante.  Es posible que tenga o sienta lo siguiente: ? Nuseas o vmitos. ? Dolor de Investment banker, operational. ? Lentitud mental. ? Fro o escalofros. ? Somnolencia. ? Cansancio. ? Dolor o inflamacin, incluso en partes del cuerpo no afectadas por la [place]. Esta informacin no tiene Marine scientist el consejo del mdico. Asegrese de hacerle al mdico cualquier pregunta que tenga. Document Released: 08/13/2010 Document Revised: 05/04/2014 Document Reviewed: 03/28/2015 Elsevier Interactive Patient Education  2018 [place] general en los adultos, cuidados posteriores (General Anesthesia, Adult, Care After) Estas indicaciones le proporcionan informacin acerca de cmo deber cuidarse despus del procedimiento. El mdico tambin  podr darle instrucciones ms especficas. El tratamiento ha sido planificado segn las prcticas mdicas actuales, pero en algunos casos pueden ocurrir problemas. Comunquese con el mdico si tiene algn problema o dudas despus del procedimiento. QU ESPERAR DESPUS DEL PROCEDIMIENTO Despus del procedimiento, es comn [employer] siguientes sntomas:  Vmitos.  Dolor de Investment banker, operational.  Lentitud mental. Es normal sentir lo siguiente:  Nuseas.  Fro o escalofros.  Somnolencia.  Cansancio.  Dolor o inflamacin, incluso en partes del cuerpo no afectadas por la cir[place]. INSTRUCCIONES PARA EL CUIDADO EN EL HOGAR Durante al menos 24horas despus del procedimiento:  No haga lo siguiente: ? Participar en actividades que impliquen posibles cadas o lesiones. ? Conducir vehculos. ? Operar maquinarias pesadas. ? Beber alcohol. ? Tomar somnferos o medicamentos que causen somnolencia. ? Firmar documentos legales ni tomar [employer]. ? Cuidar a nios por su cuenta.  Hacer reposo. Comida y bebida  Si vomita, tome agua, jugo o sopa una vez que pueda beber sin vomitar.  Beba suficiente lquido para Consulting civil engineer orina clara o de color amarillo plido.  Asegrese de no tener nuseas antes de ingerir alimentos slidos.  Siga la dieta recomendada por el mdico. Instrucciones generales  Permanezca con un adulto responsable hasta que est completamente despierto y consciente.  Retome sus  actividades normales como se lo haya indicado el mdico. Pregntele al mdico qu actividades son seguras para usted.  Tome los medicamentos de venta libre y los recetados solamente como se lo haya indicado el mdico.  Si fuma, no lo haga sin supervisin.  Concurra a todas las visitas de control como se lo haya indicado el mdico. Esto es importante. SOLICITE ATENCIN MDICA SI:  Contina con nuseas o vmitos en su casa, y los medicamentos no ayudan.  No puede beber lquidos ni volver a  comer.  No puede orinar despus de 8 a 12horas.  Tiene una erupcin cutnea.  Tiene fiebre.  Tiene cada vez ms enrojecimiento en la zona de la ciruga. SOLICITE ATENCIN MDICA DE INMEDIATO SI:  Tiene dificultad para respirar.  Siente dolor en el pecho.  Tiene una hemorragia imprevista.  Siente que tiene un problema potencialmente mortal o urgente. Esta informacin no tiene Marine scientist el consejo del mdico. Asegrese de hacerle al mdico cualquier pregunta que tenga. Document Released: 04/13/2005 Document Revised: 05/04/2014 Document Reviewed: 03/28/2015 Elsevier Interactive Patient Education  [employer].

## 2017-05-20 ENCOUNTER — Encounter (HOSPITAL_COMMUNITY)
Admission: RE | Admit: 2017-05-20 | Discharge: 2017-05-20 | Disposition: A | Discharge status: Home / Self Care | Payer: Self-pay | Source: Ambulatory Visit | Attending: Obstetrics and Gynecology | Admitting: Obstetrics and Gynecology

## 2017-05-20 ENCOUNTER — Other Ambulatory Visit: Payer: Self-pay | Admitting: Obstetrics and Gynecology

## 2017-05-20 DIAGNOSIS — Z01818 Encounter for other preprocedural examination: Secondary | ICD-10-CM | POA: Insufficient documentation

## 2017-05-20 DIAGNOSIS — D649 Anemia, unspecified: Secondary | ICD-10-CM | POA: Insufficient documentation

## 2017-05-20 DIAGNOSIS — N920 Excessive and frequent menstruation with regular cycle: Secondary | ICD-10-CM | POA: Insufficient documentation

## 2017-05-20 LAB — COMPREHENSIVE METABOLIC PANEL
ALK PHOS: 81 U/L (ref 38–126)
ALT: 44 U/L (ref 14–54)
AST: 29 U/L (ref 15–41)
Albumin: 3.8 g/dL (ref 3.5–5.0)
Anion gap: 11 (ref 5–15)
BUN: 17 mg/dL (ref 6–20)
CALCIUM: 9.1 mg/dL (ref 8.9–10.3)
CO2: 24 mmol/L (ref 22–32)
CREATININE: 0.71 mg/dL (ref 0.44–1.00)
Chloride: 100 mmol/L — ABNORMAL LOW (ref 101–111)
GFR calc Af Amer: >60 mL/min (ref >60)
GFR calc non Af Amer: >60 mL/min (ref >60)
Glucose, Bld: 90 mg/dL (ref 65–99)
Potassium: 3.6 mmol/L (ref 3.5–5.1)
SODIUM: 135 mmol/L (ref 135–145)
Total Bilirubin: 0.4 mg/dL (ref 0.3–1.2)
Total Protein: 7.7 g/dL (ref 6.5–8.1)

## 2017-05-20 LAB — CBC
HCT: 35.5 % — ABNORMAL LOW (ref 36.0–46.0)
Hemoglobin: 10.5 g/dL — ABNORMAL LOW (ref 12.0–15.0)
MCH: 22.6 pg — AB (ref 26.0–34.0)
MCHC: 29.6 g/dL — AB (ref 30.0–36.0)
MCV: 76.3 fL — ABNORMAL LOW (ref 78.0–100.0)
PLATELETS: 403 10*3/uL — AB (ref 150–400)
RBC: 4.65 MIL/uL (ref 3.87–5.11)
RDW: 23.2 % — ABNORMAL HIGH (ref 11.5–15.5)
WBC: 8.7 10*3/uL (ref 4.0–10.5)

## 2017-05-20 LAB — HCG, QUANTITATIVE, PREGNANCY

## 2017-05-20 NOTE — Progress Notes (Signed)
Patient ID: Sarah Foley, female   DOB: 07/03/1970, 47 y.o.   MRN: [id number]   Preoperative History and Physical, visit conducted with translator  Sarah Foley is a 47 y.o. [id number] here for surgical management of menorrhagia with regular cycle, anemia (unspecified).  And uterine enlargement 10-12-week size uterus.  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 She continues to bleed and it has been ongoing for the last three months. Her last pap was done about three years ago. No significant preoperative concerns.  Proposed surgery: Hysteroscopy D&C endometrial ablation with NovaSure or Minerva      Past Medical History:  Diagnosis Date  . Anemia   . Cataract    Leye  . Depression   . No pertinent past medical history         Past Surgical History:  Procedure Laterality Date  . CATARACT EXTRACTION, BILATERAL Bilateral 2010  . TUBAL LIGATION  06/2010   neg HCG prior to tubal +hcg 1 week after                   OB History  Gravida Para Term Preterm AB Living  5 5 5     5   SAB TAB Ectopic Multiple Live Births          5    # Outcome Date GA Lbr Len/2nd Weight Sex Delivery Anes PTL Lv  5 Term 03/17/11 [redacted]w[redacted]d 04:00 / 00:11 8 lb 13.3 oz (4.006 kg) M Vag-Spont None  LIV     Birth Comments: facial bruising  4 Term 2008    F Vag-Spont   LIV  3 Term     M Vag-Spont   LIV  2 Term     M Vag-Spont   LIV  1 Term     F Vag-Spont   LIV    Patient denies any other pertinent gynecologic issues.         Current Outpatient Medications on File Prior to Visit  Medication Sig Dispense Refill  . citalopram (CELEXA) 20 MG tablet 1 po qd.  Tome una tableta por boca diaria 30 tablet 0  . IRON PO Take 2 tablets by mouth 2 (two) times daily.     . megestrol (MEGACE) 40 MG tablet Take 3 daily for 5 days then 2 daily 75 tablet 3  . OVER THE COUNTER MEDICATION Take 2 tablets by mouth daily as needed  (for constipation). Prunelax Laxative    . vitamin C (ASCORBIC ACID) 500 MG tablet Take 500 mg by mouth daily.     No current facility-administered medications on file prior to visit.    No Known Allergies  Social History:   reports that  has never smoked. she has never used smokeless tobacco. She reports that she does not drink alcohol or use drugs.       Family History  Problem Relation Age of Onset  . Hypertension Sister   . Mental retardation Daughter        chromosome number 12 is incomplete  . Anesthesia problems Neg Hx   . Hypotension Neg Hx   . Malignant hyperthermia Neg Hx   . Pseudochol deficiency Neg Hx     Review of Systems: Noncontributory  PHYSICAL EXAM: Blood pressure 118/72, pulse 93, height 5\' 5"  (1.651 m), weight 209 lb (94.8 kg). General appearance - alert, well appearing, and in no distress Chest - clear to auscultation, no wheezes, rales or rhonchi, symmetric air entry  Heart - normal rate and regular rhythm Abdomen - soft, nontender, nondistended, no masses or organomegaly  Pelvic - examination  Uterus is 10-12 week size Extremities - peripheral pulses normal, no pedal edema, no clubbing or cyanosis  Labs:      Results for orders placed or performed in visit on 05/12/17 (from the past 336 hour(s))  POCT hemoglobin   Collection Time: 05/12/17 11:25 AM  Result Value Ref Range   Hemoglobin 10.6 (A) 12.2 - 16.2 g/dL  Results for orders placed or performed during the hospital encounter of 05/10/17 (from the past 336 hour(s))  Vitamin B12   Collection Time: 05/10/17  9:58 AM  Result Value Ref Range   Vitamin B-12 641 180 - 914 pg/mL  Folate   Collection Time: 05/10/17  9:58 AM  Result Value Ref Range   Folate 28.0 >5.9 ng/mL  Iron and TIBC   Collection Time: 05/10/17  9:58 AM  Result Value Ref Range   Iron 225 (H) 28 - 170 ug/dL   TIBC 479 (H) 250 - 450 ug/dL   Saturation Ratios 47 (H) 10.4 - 31.8 %   UIBC 254 ug/dL   Ferritin   Collection Time: 05/10/17  9:58 AM  Result Value Ref Range   Ferritin 15 11 - 307 ng/mL    Imaging Studies: ImagingResults  No results found.    Assessment: Jehovah's Witness, uterine fibroids, irregular and prolonged bleeding, mild anemia, normal endometrial biopsy     Patient Active Problem List   Diagnosis Date Noted  . Left ovarian cyst 12/22/2016  . Depression 04/02/2016  . Class 2 obesity with body mass index (BMI) of 35.0 to 35.9 in adult 04/02/2016  . IUD check up 10/25/2014  . Menorrhagia with regular cycle 06/28/2014  . Iron deficiency anemia 06/28/2014    Plan: 1. Patient will undergo surgical management with hysteroscopy dilation and curettage and endometrial ablation with.  (Minerva or NovaSure) 2. PAP done 3. Repeat preoperative blood work 4. Will call with results and to schedule surgery and a follow-up postoperative visit   .mec 05/12/2017 11:39 AM By signing my name below, I, Margit Banda, attest that this documentation has been prepared under the direction and in the presence of [name], MD. Electronically Signed: Margit Banda, Medical Scribe. 05/12/17. 11:16 AM.  I personally performed the services described in this documentation, which was SCRIBED in my presence. The recorded information has been reviewed and considered accurate. It has been edited as necessary during review. [name], MD

## 2017-05-20 NOTE — Progress Notes (Signed)
Patient here today for a repeat PAT for upcomming procedure per Dr Glo Herring.  Labs repeated.  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 Case discussed with Dr Patsey Berthold, who has requested to review the case with Dr Glo Herring.  Will follow up with patient, no later than tomorrow afternoon.  Explained to she and interpreter that further evaluation was necessary in order to proceed.  Verbalized understanding.

## 2017-05-21 ENCOUNTER — Encounter (HOSPITAL_COMMUNITY): Payer: Self-pay

## 2017-05-21 NOTE — Progress Notes (Signed)
Sarah Foley from [facility] contacted. A medical interpreter has been requested for the day of surgery form 7am-11am.

## 2017-05-21 NOTE — Progress Notes (Signed)
Per conversation between Dr Glo Herring and Dr. Patsey Berthold, will proceed with procedure, as planned.  Angie in office to call and make patient aware to be her on 1/29 at 0700 AM..

## 2017-05-25 ENCOUNTER — Ambulatory Visit (HOSPITAL_COMMUNITY): Payer: Self-pay | Admitting: Anesthesiology

## 2017-05-25 ENCOUNTER — Encounter (HOSPITAL_COMMUNITY)
Admission: RE | Disposition: A | Discharge status: Home / Self Care | Payer: Self-pay | Source: Ambulatory Visit | Attending: Obstetrics and Gynecology

## 2017-05-25 ENCOUNTER — Ambulatory Visit (HOSPITAL_COMMUNITY)
Admission: RE | Admit: 2017-05-25 | Discharge: 2017-05-25 | Disposition: A | Discharge status: Home / Self Care | Payer: Self-pay | Source: Ambulatory Visit | Attending: Obstetrics and Gynecology | Admitting: Obstetrics and Gynecology

## 2017-05-25 ENCOUNTER — Encounter (HOSPITAL_COMMUNITY): Payer: Self-pay | Admitting: *Deleted

## 2017-05-25 DIAGNOSIS — Z6834 Body mass index (BMI) 34.0-34.9, adult: Secondary | ICD-10-CM | POA: Insufficient documentation

## 2017-05-25 DIAGNOSIS — N920 Excessive and frequent menstruation with regular cycle: Secondary | ICD-10-CM | POA: Insufficient documentation

## 2017-05-25 DIAGNOSIS — F419 Anxiety disorder, unspecified: Secondary | ICD-10-CM | POA: Insufficient documentation

## 2017-05-25 DIAGNOSIS — D259 Leiomyoma of uterus, unspecified: Secondary | ICD-10-CM | POA: Insufficient documentation

## 2017-05-25 DIAGNOSIS — F329 Major depressive disorder, single episode, unspecified: Secondary | ICD-10-CM | POA: Insufficient documentation

## 2017-05-25 DIAGNOSIS — D649 Anemia, unspecified: Secondary | ICD-10-CM

## 2017-05-25 DIAGNOSIS — D509 Iron deficiency anemia, unspecified: Secondary | ICD-10-CM | POA: Insufficient documentation

## 2017-05-25 DIAGNOSIS — Z79899 Other long term (current) drug therapy: Secondary | ICD-10-CM | POA: Insufficient documentation

## 2017-05-25 DIAGNOSIS — N852 Hypertrophy of uterus: Secondary | ICD-10-CM

## 2017-05-25 DIAGNOSIS — E669 Obesity, unspecified: Secondary | ICD-10-CM | POA: Insufficient documentation

## 2017-05-25 HISTORY — PX: DILITATION & CURRETTAGE/HYSTROSCOPY WITH NOVASURE ABLATION: SHX5568

## 2017-05-25 SURGERY — DILATATION & CURETTAGE/HYSTEROSCOPY WITH NOVASURE ABLATION
Anesthesia: General

## 2017-05-25 MED ORDER — BUPIVACAINE-EPINEPHRINE (PF) 0.5% -1:200000 IJ SOLN
INTRAMUSCULAR | Status: AC
  Filled 2017-05-25: qty 30

## 2017-05-25 MED ORDER — IBUPROFEN 600 MG PO TABS: 600.0000 mg | ORAL_TABLET | Freq: Four times a day (QID) | ORAL | 1 refills | Status: AC | PRN

## 2017-05-25 MED ORDER — CEFAZOLIN SODIUM-DEXTROSE 2-4 GM/100ML-% IV SOLN
2.0000 g | INTRAVENOUS | Status: AC
  Administered 2017-05-25: 2 g via INTRAVENOUS

## 2017-05-25 MED ORDER — ACETAMINOPHEN 325 MG PO TABS
650.0000 mg | ORAL_TABLET | Freq: Once | ORAL | Status: AC
Start: 2017-05-25 — End: 2017-05-25
  Administered 2017-05-25: 650 mg via ORAL

## 2017-05-25 MED ORDER — HYDROCODONE-ACETAMINOPHEN 7.5-325 MG PO TABS: 1.0000 | ORAL_TABLET | Freq: Once | ORAL | Status: DC | PRN

## 2017-05-25 MED ORDER — FENTANYL CITRATE (PF) 100 MCG/2ML IJ SOLN
INTRAMUSCULAR | Status: AC
  Filled 2017-05-25: qty 2

## 2017-05-25 MED ORDER — LIDOCAINE HCL (CARDIAC) 10 MG/ML IV SOLN
INTRAVENOUS | Status: DC | PRN
  Administered 2017-05-25: 50 mg via INTRAVENOUS

## 2017-05-25 MED ORDER — LACTATED RINGERS IV SOLN
INTRAVENOUS | Status: DC
  Administered 2017-05-25: 09:00:00 via INTRAVENOUS

## 2017-05-25 MED ORDER — ONDANSETRON 4 MG PO TBDP
ORAL_TABLET | ORAL | Status: AC
  Filled 2017-05-25: qty 1

## 2017-05-25 MED ORDER — FENTANYL CITRATE (PF) 100 MCG/2ML IJ SOLN
INTRAMUSCULAR | Status: AC
Start: 2017-05-25 — End: ?
  Filled 2017-05-25: qty 2

## 2017-05-25 MED ORDER — ONDANSETRON 4 MG PO TBDP
4.0000 mg | ORAL_TABLET | Freq: Once | ORAL | Status: AC
  Administered 2017-05-25: 4 mg via ORAL

## 2017-05-25 MED ORDER — FENTANYL CITRATE (PF) 100 MCG/2ML IJ SOLN
INTRAMUSCULAR | Status: DC | PRN
  Administered 2017-05-25: 100 ug via INTRAVENOUS

## 2017-05-25 MED ORDER — HYDROCODONE-ACETAMINOPHEN 5-325 MG PO TABS: 1.0000 | ORAL_TABLET | Freq: Four times a day (QID) | ORAL | 0 refills | Status: AC | PRN

## 2017-05-25 MED ORDER — SODIUM CHLORIDE 0.9 % IR SOLN
Status: DC | PRN
  Administered 2017-05-25: 3000 mL

## 2017-05-25 MED ORDER — CEFAZOLIN SODIUM-DEXTROSE 2-4 GM/100ML-% IV SOLN
INTRAVENOUS | Status: AC
  Filled 2017-05-25: qty 100

## 2017-05-25 MED ORDER — BUPIVACAINE-EPINEPHRINE (PF) 0.5% -1:200000 IJ SOLN
INTRAMUSCULAR | Status: DC | PRN
  Administered 2017-05-25: 27.5 mL

## 2017-05-25 MED ORDER — FENTANYL CITRATE (PF) 100 MCG/2ML IJ SOLN
25.0000 ug | INTRAMUSCULAR | Status: DC | PRN
  Administered 2017-05-25 (×2): 50 ug via INTRAVENOUS

## 2017-05-25 MED ORDER — PROPOFOL 10 MG/ML IV BOLUS
INTRAVENOUS | Status: DC | PRN
  Administered 2017-05-25: 20 mg via INTRAVENOUS
  Administered 2017-05-25: 180 mg via INTRAVENOUS
  Administered 2017-05-25 (×2): 20 mg via INTRAVENOUS

## 2017-05-25 MED ORDER — ACETAMINOPHEN 325 MG PO TABS
ORAL_TABLET | ORAL | Status: AC
  Filled 2017-05-25: qty 2

## 2017-05-25 SURGICAL SUPPLY — 25 items
ABLATOR ENDOMETRIAL BIPOLAR (ABLATOR) ×3 IMPLANT
BAG HAMPER (MISCELLANEOUS) ×3 IMPLANT
CLOTH BEACON ORANGE TIMEOUT ST (SAFETY) ×3 IMPLANT
COVER LIGHT HANDLE STERIS (MISCELLANEOUS) ×6 IMPLANT
DECANTER SPIKE VIAL GLASS SM (MISCELLANEOUS) ×3 IMPLANT
GLOVE BIOGEL PI IND STRL 7.0 (GLOVE) ×2 IMPLANT
GLOVE BIOGEL PI IND STRL 9 (GLOVE) ×1 IMPLANT
GLOVE BIOGEL PI INDICATOR 7.0 (GLOVE) ×4
GLOVE BIOGEL PI INDICATOR 9 (GLOVE) ×2
GLOVE ECLIPSE 6.5 STRL STRAW (GLOVE) ×3 IMPLANT
GLOVE ECLIPSE 9.0 STRL (GLOVE) ×3 IMPLANT
GOWN SPEC L3 XXLG W/TWL (GOWN DISPOSABLE) ×3 IMPLANT
GOWN STRL REUS W/TWL LRG LVL3 (GOWN DISPOSABLE) ×3 IMPLANT
INST SET HYSTEROSCOPY (KITS) ×3 IMPLANT
IV NS IRRIG 3000ML ARTHROMATIC (IV SOLUTION) ×3 IMPLANT
KIT ROOM TURNOVER AP CYSTO (KITS) ×3 IMPLANT
KIT ROOM TURNOVER APOR (KITS) ×3 IMPLANT
MANIFOLD NEPTUNE II (INSTRUMENTS) ×3 IMPLANT
NS IRRIG 1000ML POUR BTL (IV SOLUTION) ×3 IMPLANT
PACK PERI GYN (CUSTOM PROCEDURE TRAY) ×3 IMPLANT
PAD ARMBOARD 7.5X6 YLW CONV (MISCELLANEOUS) ×3 IMPLANT
PAD TELFA 3X4 1S STER (GAUZE/BANDAGES/DRESSINGS) ×3 IMPLANT
SET BASIN LINEN APH (SET/KITS/TRAYS/PACK) ×3 IMPLANT
SET IRRIG Y TYPE TUR BLADDER L (SET/KITS/TRAYS/PACK) ×3 IMPLANT
SYR CONTROL 10ML LL (SYRINGE) ×3 IMPLANT

## 2017-05-25 NOTE — Anesthesia Preprocedure Evaluation (Addendum)
Anesthesia Evaluation  Patient identified by MRN, date of birth, ID band Patient awake    Airway Mallampati: II       Dental no notable dental hx.    Pulmonary neg pulmonary ROS,    Pulmonary exam normal        Cardiovascular Exercise Tolerance: Good Normal cardiovascular exam Rhythm:Regular Rate:Normal     Neuro/Psych Anxiety negative neurological ROS     GI/Hepatic negative GI ROS, Neg liver ROS,   Endo/Other    Renal/GU negative Renal ROS     Musculoskeletal   Abdominal   Peds  Hematology  (+) anemia , Jehovah's Witness  Results for Sarah Foley, Sarah Foley (MRN [id number]) as of 05/25/2017 08:06  05/20/2017 13:16 WBC: 8.7 RBC: 4.65 Hemoglobin: 10.5 (L) HCT: 35.5 (L) MCV: 76.3 (L) MCH: 22.6 (L) MCHC: 29.6 (L) RDW: 23.2 (H) Platelets: 403 (H)    Anesthesia Other Findings Results for Sarah Foley, Sarah Foley (MRN [id number]) as of 05/25/2017 08:06  05/20/2017 13:15 HCG, Beta Chain, Quant, S: <1   Reproductive/Obstetrics                            Anesthesia Physical Anesthesia Plan  ASA: II  Anesthesia Plan: General   Post-op Pain Management:    Induction:   PONV Risk Score and Plan:   Airway Management Planned: LMA  Additional Equipment:   Intra-op Plan:   Post-operative Plan:   Informed Consent: I have reviewed the patients History and Physical, chart, labs and discussed the procedure including the risks, benefits and alternatives for the proposed anesthesia with the patient or authorized representative who has indicated his/her understanding and acceptance.   Dental advisory given  Plan Discussed with: CRNA and Surgeon  Anesthesia Plan Comments:         Anesthesia Quick Evaluation

## 2017-05-25 NOTE — Brief Op Note (Signed)
05/25/2017  9:48 AM  PATIENT:  Sarah Foley  47 y.o. female  PRE-OPERATIVE DIAGNOSIS:  Heavy Menses, Anemia, Jehovah's witness  POST-OPERATIVE DIAGNOSIS:  Heavy Menses, Anemia, Jehovah's witness, uterine enlargement  PROCEDURE:  Procedure(s): DILATATION & CURETTAGE/HYSTEROSCOPY WITH NOVASURE ENDOMETRIAL ABLATION (N/A)  SURGEON:  Surgeon(s) and Role:    [name], MD - Primary  PHYSICIAN ASSISTANT:   ASSISTANTS: Zoila Shutter CST  ANESTHESIA:   local, general and paracervical block  EBL:  0 mL   BLOOD ADMINISTERED:none  DRAINS: none   LOCAL MEDICATIONS USED:  MARCAINE    and Amount: 28 ml  SPECIMEN:  No Specimen  DISPOSITION OF SPECIMEN:  N/A  COUNTS:  YES  TOURNIQUET:  * No tourniquets in log *  DICTATION: .Dragon Dictation  PLAN OF CARE: Discharge to home after PACU  PATIENT DISPOSITION:  PACU - hemodynamically stable.   Delay start of Pharmacological VTE agent (>24hrs) due to surgical blood loss or risk of bleeding: not applicable Details of procedure: Patient was taken to the operating room prepped and draped for procedure confirmed by surgical team.  Paracervical block was applied times 28 cc of Marcaine with Ancef administered.  Speculum was inserted the cervix grasped the uterus sounded to 11 cm in the anteflexed position, dilated easily to 23 [place] allowing introduction of the 30 degree rigid hysteroscope without difficulty.  There was small bit of clots in the endometrial cavity and a smooth contour.  The patient had been on Megace.  There was a small bit of clots posteriorly.  The tubal ostia could be visualized bilaterally.  There were no polyps or significant deformity of the endometrial cavity though the cavity did appear rather large.  The NovaSure endometrial ablation device was prepped passed on the table, prepared activated after insertion in the uterine cavity.  The uterus had been sounded to 6-1/2-7 cm in length, of the actual  endometrial cavity and the width was measured at 4.7 cm then the endometrial ablation sequence activated.  The 1 minute 0-second complete device activation time completed the procedure which was confirmed by repeating the hysteroscopy.  Hysteroscopy showed a excellent tissue change in all the endometrial cavity except the right uterine cornual area approximately 1-2 cm around the area of the right tubal ostia which did not appear to have undergone the thermal changes.  On the left cornual area of the ablation device is apparently seated nicely all the way up to the cornu. Procedure was completed and patient to recovery room in good condition she will be discharged home on ibuprofen 600 mg oral every 6 hours and Vicodin.

## 2017-05-25 NOTE — Op Note (Signed)
Please see the brief operative note for surgical details 

## 2017-05-25 NOTE — Transfer of Care (Signed)
Immediate Anesthesia Transfer of Care Note  Patient: Sarah Foley  Procedure(s) Performed: DILATATION & CURETTAGE/HYSTEROSCOPY WITH NOVASURE ENDOMETRIAL ABLATION (N/A )  Patient Location: PACU  Anesthesia Type:General  Level of Consciousness: awake and patient cooperative  Airway & Oxygen Therapy: Patient Spontanous Breathing  Post-op Assessment: Report given to RN and Post -op Vital signs reviewed and stable  Post vital signs: Reviewed and stable  Last Vitals:  Vitals:   05/25/17 0835 05/25/17 0840  BP: 128/72   Pulse:    Resp: (!) 0 13  Temp:    SpO2: 98% 100%    Last Pain:  Vitals:   05/25/17 0731  TempSrc: Oral      Patients Stated Pain Goal: 4 (38/88/75 7972)  Complications: No apparent anesthesia complications

## 2017-05-25 NOTE — H&P (Signed)
Preoperative History and Physical, visit conducted with translator  Sarah Foley is a 47 y.o. [id number] here for surgical management of menorrhagia with regular cycle, anemia (unspecified).  And uterine enlargement 10-12-week size uterus.  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 She continues to bleed and it has been ongoing for the last three months. Her last pap was done about three years ago. No significant preoperative concerns.  Proposed surgery: Hysteroscopy D&C endometrial ablation with NovaSure or Minerva      Past Medical History:  Diagnosis Date  . Anemia   . Cataract    Leye  . Depression   . No pertinent past medical history         Past Surgical History:  Procedure Laterality Date  . CATARACT EXTRACTION, BILATERAL Bilateral 2010  . TUBAL LIGATION  06/2010   neg HCG prior to tubal +hcg 1 week after                   OB History  Gravida Para Term Preterm AB Living  5 5 5     5   SAB TAB Ectopic Multiple Live Births          5    # Outcome Date GA Lbr Len/2nd Weight Sex Delivery Anes PTL Lv  5 Term 03/17/11 [redacted]w[redacted]d 04:00 / 00:11 8 lb 13.3 oz (4.006 kg) M Vag-Spont None  LIV     Birth Comments: facial bruising  4 Term 2008    F Vag-Spont   LIV  3 Term     M Vag-Spont   LIV  2 Term     M Vag-Spont   LIV  1 Term     F Vag-Spont   LIV    Patient denies any other pertinent gynecologic issues.         Current Outpatient Medications on File Prior to Visit  Medication Sig Dispense Refill  . citalopram (CELEXA) 20 MG tablet 1 po qd.  Tome una tableta por boca diaria 30 tablet 0  . IRON PO Take 2 tablets by mouth 2 (two) times daily.     . megestrol (MEGACE) 40 MG tablet Take 3 daily for 5 days then 2 daily 75 tablet 3  . OVER THE COUNTER MEDICATION Take 2 tablets by mouth daily as needed (for constipation). Prunelax Laxative    . vitamin C (ASCORBIC ACID) 500 MG tablet Take 500  mg by mouth daily.     No current facility-administered medications on file prior to visit.    No Known Allergies  Social History:   reports that  has never smoked. she has never used smokeless tobacco. She reports that she does not drink alcohol or use drugs.       Family History  Problem Relation Age of Onset  . Hypertension Sister   . Mental retardation Daughter        chromosome number 12 is incomplete  . Anesthesia problems Neg Hx   . Hypotension Neg Hx   . Malignant hyperthermia Neg Hx   . Pseudochol deficiency Neg Hx     Review of Systems: Noncontributory  PHYSICAL EXAM: Blood pressure 118/72, pulse 93, height 5\' 5"  (1.651 m), weight 209 lb (94.8 kg). General appearance - alert, well appearing, and in no distress Chest - clear to auscultation, no wheezes, rales or rhonchi, symmetric air entry Heart - normal rate and regular rhythm Abdomen - soft, nontender, nondistended, no masses or organomegaly  Pelvic - examination  Uterus is 10-12 week size Extremities - peripheral pulses normal, no pedal edema, no clubbing or cyanosis  Labs: Results for orders placed or performed in visit on 05/12/17 (from the past 336 hour(s))  POCT hemoglobin   Collection Time: 05/12/17 11:25 AM  Result Value Ref Range   Hemoglobin 10.6 (A) 12.2 - 16.2 g/dL  Results for orders placed or performed during the hospital encounter of 05/10/17 (from the past 336 hour(s))  Vitamin B12   Collection Time: 05/10/17  9:58 AM  Result Value Ref Range   Vitamin B-12 641 180 - 914 pg/mL  Folate   Collection Time: 05/10/17  9:58 AM  Result Value Ref Range   Folate 28.0 >5.9 ng/mL  Iron and TIBC   Collection Time: 05/10/17  9:58 AM  Result Value Ref Range   Iron 225 (H) 28 - 170 ug/dL   TIBC 479 (H) 250 - 450 ug/dL   Saturation Ratios 47 (H) 10.4 - 31.8 %   UIBC 254 ug/dL  Ferritin   Collection Time: 05/10/17  9:58 AM  Result Value Ref Range   Ferritin 15 11 - 307  ng/mL   CBC    Component Value Date/Time   WBC 8.7 05/20/2017 1316   RBC 4.65 05/20/2017 1316   HGB 10.5 (L) 05/20/2017 1316   HCT 35.5 (L) 05/20/2017 1316   PLT 403 (H) 05/20/2017 1316   MCV 76.3 (L) 05/20/2017 1316   MCH 22.6 (L) 05/20/2017 1316   MCHC 29.6 (L) 05/20/2017 1316   RDW 23.2 (H) 05/20/2017 1316   BMP Latest Ref Rng & Units 05/20/2017 04/07/2017 04/07/2016  Glucose 65 - 99 mg/dL 90 118(H) 90  BUN 6 - 20 mg/dL 17 11 12   Creatinine 0.44 - 1.00 mg/dL 0.71 0.69 0.65  Sodium 135 - 145 mmol/L 135 137 138  Potassium 3.5 - 5.1 mmol/L 3.6 3.7 4.2  Chloride 101 - 111 mmol/L 100(L) 106 106  CO2 22 - 32 mmol/L 24 23 28   Calcium 8.9 - 10.3 mg/dL 9.1 9.3 8.5(L)     Imaging Studies: ImagingResults  No results found.    Assessment: Jehovah's Witness, uterine fibroids, irregular and prolonged bleeding, mild anemia, normal endometrial biopsy     Patient Active Problem List   Diagnosis Date Noted  . Left ovarian cyst 12/22/2016  . Depression 04/02/2016  . Class 2 obesity with body mass index (BMI) of 35.0 to 35.9 in adult 04/02/2016  . IUD check up 10/25/2014  . Menorrhagia with regular cycle 06/28/2014  . Iron deficiency anemia 06/28/2014    Plan: 1. Patient will undergo surgical management with hysteroscopy dilation and curettage and endometrial ablation with Minerva or NovaSure ablation device 2. PAP done, results normal.

## 2017-05-25 NOTE — Discharge Instructions (Signed)
Hysteroscopy Hysteroscopy is a procedure used for looking inside the womb (uterus). It may be done for various reasons, including:  To evaluate abnormal bleeding, fibroid (benign, noncancerous) tumors, polyps, scar tissue (adhesions), and possibly cancer of the uterus.  To look for lumps (tumors) and other uterine growths.  To look for causes of why a woman cannot get pregnant (infertility), causes of recurrent loss of pregnancy (miscarriages), or a lost intrauterine device (IUD).  To perform a sterilization by blocking the fallopian tubes from inside the uterus.  In this procedure, a thin, flexible tube with a tiny light and camera on the end of it (hysteroscope) is used to look inside the uterus. A hysteroscopy should be done right after a menstrual period to be sure you are not pregnant. LET [facility] CARE PROVIDER KNOW ABOUT:  Any allergies you have.  All medicines you are taking, including vitamins, herbs, eye drops, creams, and over-the-counter medicines.  Previous problems you or members of your family have had with the use of anesthetics.  Any blood disorders you have.  Previous surgeries you have had.  Medical conditions you have. RISKS AND COMPLICATIONS Generally, this is a safe procedure. However, as with any procedure, complications can occur. Possible complications include:  Putting a hole in the uterus.  Excessive bleeding.  Infection.  Damage to the cervix.  Injury to other organs.  Allergic reaction to medicines.  Too much fluid used in the uterus for the procedure.  BEFORE THE PROCEDURE  Ask your health care provider about changing or stopping any regular medicines.  Do not take aspirin or blood thinners for 1 week before the procedure, or as directed by your health care provider. These can cause bleeding.  If you smoke, do not smoke for 2 weeks before the procedure.  In some cases, a medicine is placed in the cervix the day before the procedure.  This medicine makes the cervix have a larger opening (dilate). This makes it easier for the instrument to be inserted into the uterus during the procedure.  Do not eat or drink anything for at least 8 hours before the surgery.  Arrange for someone to take you home after the procedure. PROCEDURE  You may be given a medicine to relax you (sedative). You may also be given one of the following: ? A medicine that numbs the area around the cervix (local anesthetic). ? A medicine that makes you sleep through the procedure (general anesthetic).  The hysteroscope is inserted through the vagina into the uterus. The camera on the hysteroscope sends a picture to a TV screen. This gives the surgeon a good view inside the uterus.  During the procedure, air or a liquid is put into the uterus, which allows the surgeon to see better.  Sometimes, tissue is gently scraped from inside the uterus. These tissue samples are sent to a lab for testing. What to expect after the procedure  If you had a general anesthetic, you may be groggy for a couple hours after the procedure.  If you had a local anesthetic, you will be able to go home as soon as you are stable and feel ready.  You may have some cramping. This normally lasts for a couple days.  You may have bleeding, which varies from light spotting for a few days to menstrual-like bleeding for 3-7 days. This is normal.  If your test results are not back during the visit, make an appointment with your health care provider to find out the  results. °This information is not intended to replace advice given to you by your health care provider. Make sure you discuss any questions you have with your health care provider. °Document Released: 07/20/2000 Document Revised: 09/19/2015 Document Reviewed: 11/10/2012 °Elsevier Interactive Patient Education © 2017 Elsevier Inc. °Endometrial Ablation °Endometrial ablation is a procedure that destroys the thin inner layer of the  lining of the uterus (endometrium). This procedure may be done: °· To stop heavy periods. °· To stop bleeding that is causing anemia. °· To control irregular bleeding. °· To treat bleeding caused by small tumors (fibroids) in the endometrium. ° °This procedure is often an alternative to major surgery, such as removal of the uterus and cervix (hysterectomy). As a result of this procedure: °· You may not be able to have children. However, if you are premenopausal (you have not gone through menopause): °? You may still have a small chance of getting pregnant. °? You will need to use a reliable method of birth control after the procedure to prevent pregnancy. °· You may stop having a menstrual period, or you may have only a small amount of bleeding during your period. Menstruation may return several years after the procedure. ° °Tell a health care provider about: °· Any allergies you have. °· All medicines you are taking, including vitamins, herbs, eye drops, creams, and over-the-counter medicines. °· Any problems you or family members have had with the use of anesthetic medicines. °· Any blood disorders you have. °· Any surgeries you have had. °· Any medical conditions you have. °What are the risks? °Generally, this is a safe procedure. However, problems may occur, including: °· A hole (perforation) in the uterus or bowel. °· Infection of the uterus, bladder, or vagina. °· Bleeding. °· Damage to other structures or organs. °· An air bubble in the lung (air embolus). °· Problems with pregnancy after the procedure. °· Failure of the procedure. °· Decreased ability to diagnose cancer in the endometrium. ° °What happens before the procedure? °· You will have tests of your endometrium to make sure there are no pre-cancerous cells or cancer cells present. °· You may have an ultrasound of the uterus. °· You may be given medicines to thin the endometrium. °· Ask your health care provider about: °? Changing or stopping your  regular medicines. This is especially important if you take diabetes medicines or blood thinners. °? Taking medicines such as aspirin and ibuprofen. These medicines can thin your blood. Do not take these medicines before your procedure if your doctor tells you not to. °· Plan to have someone take you home from the hospital or clinic. °What happens during the procedure? °· You will lie on an exam table with your feet and legs supported as in a pelvic exam. °· To lower your risk of infection: °? Your health care team will wash or sanitize their hands and put on germ-free (sterile) gloves. °? Your genital area will be washed with soap. °· An IV tube will be inserted into one of your veins. °· You will be given a medicine to help you relax (sedative). °· A surgical instrument with a light and camera (resectoscope) will be inserted into your vagina and moved into your uterus. This allows your surgeon to see inside your uterus. °· Endometrial tissue will be removed using one of the following methods: °? Radiofrequency. This method uses a radiofrequency-alternating electric current to remove the endometrium. °? Cryotherapy. This method uses extreme cold to freeze the endometrium. °? Heated-free   liquid. This method uses a heated saltwater (saline) solution to remove the endometrium. °? Microwave. This method uses high-energy microwaves to heat up the endometrium and remove it. °? Thermal balloon. This method involves inserting a catheter with a balloon tip into the uterus. The balloon tip is filled with heated fluid to remove the endometrium. °The procedure may vary among health care providers and hospitals. °What happens after the procedure? °· Your blood pressure, heart rate, breathing rate, and blood oxygen level will be monitored until the medicines you were given have worn off. °· As tissue healing occurs, you may notice vaginal bleeding for 4-6 weeks after the procedure. You may also experience: °? Cramps. °? Thin,  watery vaginal discharge that is light pink or brown in color. °? A need to urinate more frequently than usual. °? Nausea. °· Do not drive for 24 hours if you were given a sedative. °· Do not have sex or insert anything into your vagina until your health care provider approves. °Summary °· Endometrial ablation is done to treat the many causes of heavy menstrual bleeding. °· The procedure may be done only after medications have been tried to control the bleeding. °· Plan to have someone take you home from the hospital or clinic. °This information is not intended to replace advice given to you by your health care provider. Make sure you discuss any questions you have with your health care provider. °Document Released: 02/21/2004 Document Revised: 04/30/2016 Document Reviewed: 04/30/2016 °Elsevier Interactive Patient Education © 2017 Elsevier Inc. °Histeroscopia °La histeroscopia es un procedimiento que se usa para mirar dentro del útero (útero). Se puede hacer por varias razones, incluyendo: °Para evaluar sangrado anormal, tumores fibroides (benignos, no cancerosos), pólipos, tejido cicatricial (adherencias) y, posiblemente, cáncer de útero. °Para buscar bultos (tumores) y otros crecimientos uterinos. °Para buscar las causas de por qué una mujer no puede quedar embarazada (infertilidad), las causas de la pérdida recurrente de embarazo o la pérdida de un dispositivo intrauterino (DIU). °Para realizar la esterilización mediante el bloqueo de las trompas de [place] desde el interior del útero. ° °En este procedimiento, se usa un tubo delgado y flexible con una luz diminuta y una cámara en el extremo (histeroscopio) para mirar dentro del útero. La histeroscopia debe realizarse justo después del período menstrual para asegurarse de que no esté embarazada. °DEJE QUE SU PROVEEDOR DE ATENCIÓN MÉDICA SABE SOBRE: °Cualquier alergia que tenga. °Todos los medicamentos que toma, incluidas vitaminas, hierbas, gotas para los ojos,  cremas y medicamentos de venta libre. °Problemas anteriores que usted o sus familiares han tenido con el uso de anestésicos. °Cualquier trastorno sanguíneo que tengas. °Cirugías previas que has tenido. °Las condiciones médicas que tiene. °RIESGOS Y COMPLICACIONES °En general, este es un procedimiento seguro. Sin embargo, como con cualquier procedimiento, pueden surgir complicaciones. Las posibles complicaciones incluyen: °Poniendo un agujero en el útero. °Sangrado excesivo. °Infeccion °Daños al cuello uterino. °Lesiones a otros órganos. °Reacción alérgica a los medicamentos. °Demasiado líquido utilizado en el útero para el procedimiento. ° °ANTES DEL PROCEDIMIENTO °Pregúntele a su proveedor de atención médica acerca de cambiar o suspender cualquier medicamento regular. °No tome aspirina ni anticoagulantes durante 1 semana antes del procedimiento, o según las indicaciones de su proveedor de atención médica. Estos pueden causar sangrado. °Si fuma, no fume durante 2 semanas antes del procedimiento. °En algunos casos, el medicamento se coloca en el cuello uterino el día antes del procedimiento. Este medicamento hace que el cérvix tenga una abertura más grande (dilatar). Esto facilita la   inserción del instrumento en el útero durante el procedimiento. °No coma ni beba nada durante al menos 8 horas antes de la cirugía. °Haga arreglos para que alguien lo lleve a casa después del procedimiento. °PROCEDIMIENTO °Se le puede administrar un medicamento para relajarlo (sedante). También se le puede dar uno de los siguientes: °Un medicamento que adormece el área alrededor del cuello uterino (anestesia local). °Un medicamento que le hace dormir a través del procedimiento (anestesia general). °El histeroscopio se inserta a través de la vagina en el útero. La cámara del histeroscopio envía una imagen a la pantalla de un televisor. Esto le da al cirujano una buena vista dentro del útero. °Durante el procedimiento, se introduce aire o  un líquido en el útero, lo que permite que el cirujano vea mejor. °A veces, el tejido se raspa suavemente desde el interior del útero. Estas muestras de tejido se envían a un laboratorio para su análisis. °Qué esperar después del procedimiento °Si recibió anestesia general, es posible que esté aturdido por un par de horas después del procedimiento. °Si recibió anestesia local, podrá irse a casa tan pronto como esté estable y se sienta listo. °Puede tener algunos cólicos. Esto suele durar un par de días. °Es posible que tenga sangrado, que varía desde manchas leves durante unos días hasta sangrado menstrual durante 3 a 7 días. Esto es normal °Si los resultados de su prueba no regresan durante la visita, haga una cita con su proveedor de atención médica para averiguar los resultados. °Esta información no pretende reemplazar el consejo que le proporcionó su proveedor de atención médica. Asegúrese de discutir cualquier pregunta que tenga con su proveedor de atención médica. °Documento Publicado: 26/06/2000 Documento revisado: 25/08/2015 Documento revisado: 17/10/2012 °Elsevier Interactive Patient Education © 2017 Elsevier Inc. °Ablación endometrial °La ablación endometrial es un procedimiento que destruye la capa interna delgada del revestimiento del útero (endometrio). Este procedimiento se puede hacer: °Para detener los períodos pesados. °Para detener el sangrado que está causando anemia. °Para controlar el sangrado irregular. °Para tratar el sangrado causado por tumores pequeños (fibromas) en el endometrio. ° °Este procedimiento suele ser una alternativa a la cirugía mayor, como la extracción del útero y el cuello uterino (histerectomía). Como resultado de este procedimiento: °Es posible que no pueda tener hijos. Sin embargo, si usted es premenopáusica (no ha pasado por la menopausia): °Es posible que aún tengas una pequeña posibilidad de quedar embarazada. °Deberá usar un método anticonceptivo confiable después del  procedimiento para prevenir el embarazo. °Puede dejar de tener un período menstrual, o puede tener solo una pequeña cantidad de sangrado durante su período. La menstruación puede volver varios años después del procedimiento. ° °Informe a un proveedor de atención médica sobre: °Cualquier alergia que tenga. °Todos los medicamentos que toma, incluidas vitaminas, hierbas, gotas para los ojos, cremas y medicamentos de venta libre. °Cualquier problema que usted o sus familiares hayan tenido con el uso de medicamentos anestésicos. °Cualquier trastorno sanguíneo que tengas. °Cirugías anuales que has tenido. °Cualquier condición médica que tenga. °¿Cuáles son los riesgos? °En general, este es un procedimiento seguro. Sin embargo, problema ° °

## 2017-05-25 NOTE — Anesthesia Procedure Notes (Signed)
Procedure Name: LMA Insertion Date/Time: 05/25/2017 9:07 AM Performed by: Vista Deck, CRNA Pre-anesthesia Checklist: Patient identified, Patient being monitored, Emergency Drugs available, Timeout performed and Suction available Patient Re-evaluated:Patient Re-evaluated prior to induction Oxygen Delivery Method: Circle System Utilized Preoxygenation: Pre-oxygenation with 100% oxygen Induction Type: IV induction Ventilation: Mask ventilation without difficulty LMA: LMA inserted LMA Size: 4.0 Number of attempts: 1 Placement Confirmation: positive ETCO2 and breath sounds checked- equal and bilateral Tube secured with: Tape Dental Injury: Teeth and Oropharynx as per pre-operative assessment

## 2017-05-25 NOTE — Anesthesia Postprocedure Evaluation (Signed)
Anesthesia Post Note  Patient: Sarah Foley  Procedure(s) Performed: DILATATION & CURETTAGE/HYSTEROSCOPY WITH NOVASURE ENDOMETRIAL ABLATION (N/A )  Patient location during evaluation: PACU Anesthesia Type: General Level of consciousness: awake, patient cooperative and awake and alert Pain management: satisfactory to patient Vital Signs Assessment: post-procedure vital signs reviewed and stable Respiratory status: spontaneous breathing Cardiovascular status: stable Postop Assessment: no apparent nausea or vomiting Anesthetic complications: no     Last Vitals:  Vitals:   05/25/17 0945 05/25/17 1000  BP: (!) 88/52 105/61  Pulse: 67 62  Resp: 14 15  Temp: 36.9 C   SpO2: 98% 96%    Last Pain:  Vitals:   05/25/17 1000  TempSrc:   PainSc: Asleep                 Oluwanifemi Susman

## 2017-05-26 ENCOUNTER — Encounter (HOSPITAL_COMMUNITY): Payer: Self-pay | Admitting: Obstetrics and Gynecology

## 2017-05-31 ENCOUNTER — Ambulatory Visit: Payer: Self-pay | Admitting: Physician Assistant

## 2017-06-02 ENCOUNTER — Ambulatory Visit (INDEPENDENT_AMBULATORY_CARE_PROVIDER_SITE_OTHER): Payer: Self-pay | Admitting: Obstetrics and Gynecology

## 2017-06-02 ENCOUNTER — Encounter: Payer: Self-pay | Admitting: Obstetrics and Gynecology

## 2017-06-02 VITALS — BP 102/60 | HR 87 | Ht 65.0 in | Wt 209.0 lb

## 2017-06-02 DIAGNOSIS — Z01818 Encounter for other preprocedural examination: Secondary | ICD-10-CM

## 2017-06-02 DIAGNOSIS — Z09 Encounter for follow-up examination after completed treatment for conditions other than malignant neoplasm: Secondary | ICD-10-CM

## 2017-06-02 NOTE — Progress Notes (Addendum)
Patient ID: Sarah Foley, female   DOB: February 13, 1971, 47 y.o.   MRN: [id number]    Subjective:  Sarah Foley is a 47 y.o. female now 1 weeks status post [place].   The patient is feeling bloated and is experiencing mild pain and mild erythema. She denies any other symptoms or complaints at this time.    Review of Systems Negative except bloating, mild pain and mild erythema   Diet:   normal   Bowel movements : normal.  Pain is controlled with current analgesics. Medications being used: ibuprofen (OTC).  Objective:  BP 102/60 (BP Location: Right Arm, Patient Position: Sitting, Cuff Size: Large)   Pulse 87   Ht 5\' 5"  (1.651 m)   Wt 209 lb (94.8 kg)   BMI 34.78 kg/m  General:Well developed, well nourished.  No acute distress. Abdomen: Bowel sounds normal, soft, non-tender. Pelvic Exam:    External Genitalia:  Normal.    Vagina: Normal    Cervix: Normal    Uterus: Normal, mild tenderness from procdure    Adnexa/Bimanual: Normal  Incision(s):   Healing well, no drainage, no erythema, no hernia, no swelling, no dehiscence,     Assessment:  Post-Op 1 weeks s/p DILATATION & CURETTAGE/HYSTEROSCOPY WITH NOVASURE ENDOMETRIAL ABLATION   Doing well postoperatively.  Denies fever chills nausea vomiting   Plan:  1.Wound care discussed   2. . current medications. 3. Activity restrictions: none 4. return to work: now. 5. Follow up PRN.  By signing my name below, I, Margit Banda, attest that this documentation has been prepared under the direction and in the presence of [name], MD. Electronically Signed: Margit Banda, Medical Scribe. 06/02/17. 11:55 AM.  I personally performed the services described in this documentation, which was SCRIBED in my presence. The recorded information has been reviewed and considered accurate. It has been edited as necessary during review. [name],  MD

## 2017-06-10 ENCOUNTER — Ambulatory Visit: Payer: Self-pay | Admitting: Physician Assistant

## 2017-06-14 ENCOUNTER — Encounter: Payer: Self-pay | Admitting: Physician Assistant

## 2017-08-10 ENCOUNTER — Other Ambulatory Visit: Payer: Self-pay | Admitting: Physician Assistant

## 2017-08-10 DIAGNOSIS — Z1231 Encounter for screening mammogram for malignant neoplasm of breast: Secondary | ICD-10-CM

## 2017-09-01 ENCOUNTER — Ambulatory Visit
Admission: RE | Admit: 2017-09-01 | Discharge: 2017-09-01 | Disposition: A | Discharge status: Home / Self Care | Payer: No Typology Code available for payment source | Source: Ambulatory Visit | Attending: Physician Assistant | Admitting: Physician Assistant

## 2017-09-01 DIAGNOSIS — Z1231 Encounter for screening mammogram for malignant neoplasm of breast: Secondary | ICD-10-CM

## 2017-09-07 IMAGING — US US PELVIS COMPLETE
1 series · 14 of 25 positions shown · non-contrast
Comparison: None

CLINICAL DATA: Menorrhagia.  Anemia.

EXAM:
TRANSABDOMINAL AND TRANSVAGINAL ULTRASOUND OF PELVIS
TECHNIQUE: Both transabdominal and transvaginal ultrasound examinations of the
pelvis were performed. Transabdominal technique was performed for
global imaging of the pelvis including uterus, ovaries, adnexal
regions, and pelvic cul-de-sac. It was necessary to proceed with
endovaginal exam following the transabdominal exam to visualize the
endometrium and ovaries.

[Series 1: us pelvis complete · 0.25mm/px · 14 of 94 slices shown]
[im 1/94]
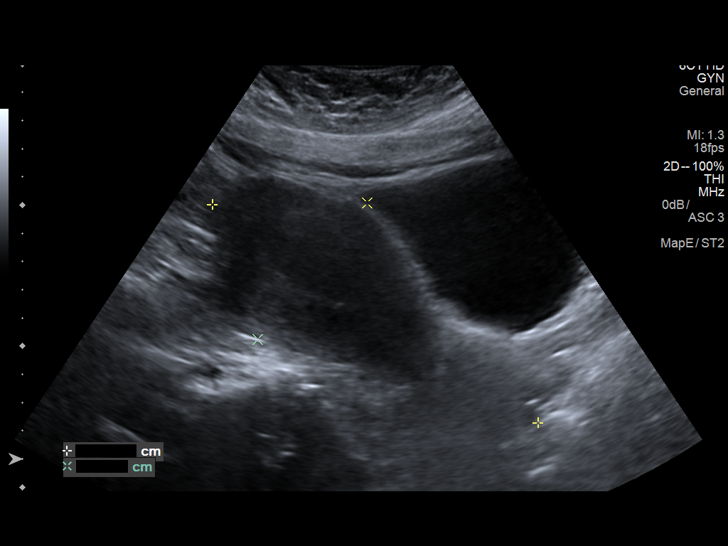
[im 8/94]
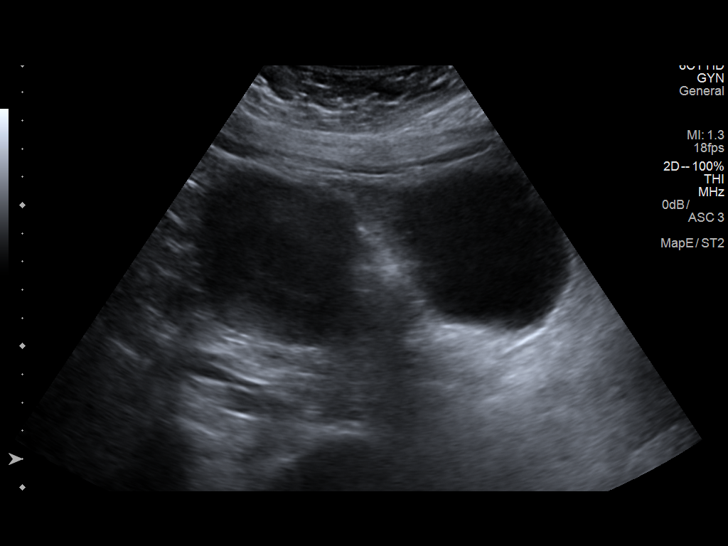
[im 16/94]
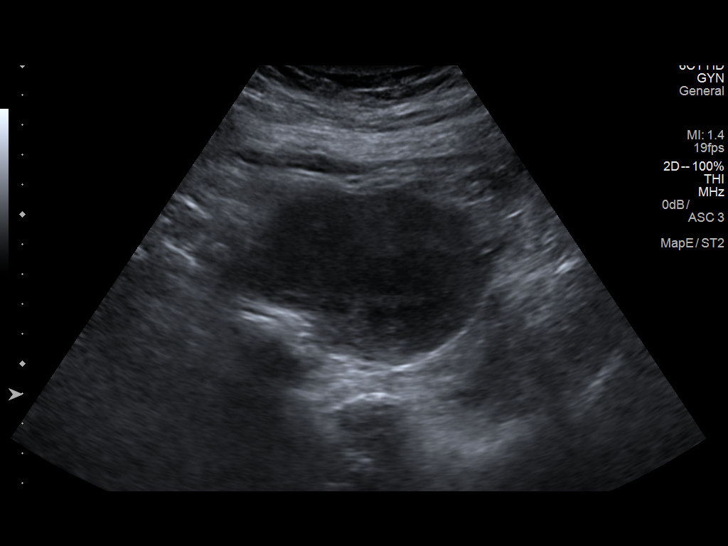
[im 24/94]
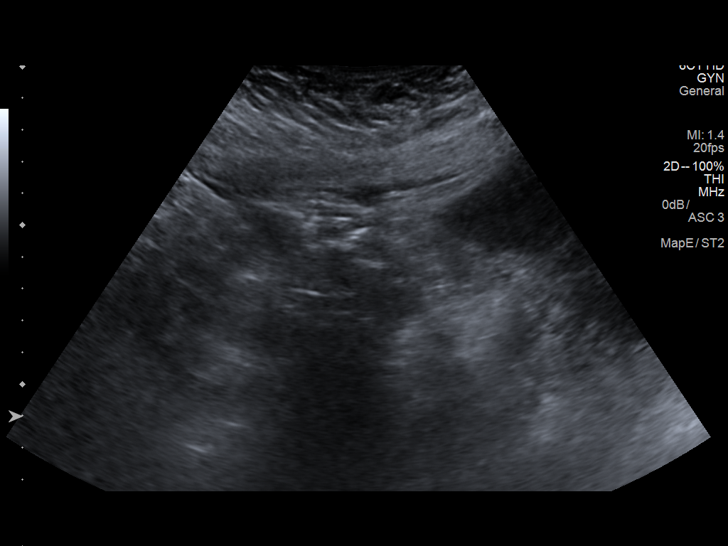
[im 32/94]
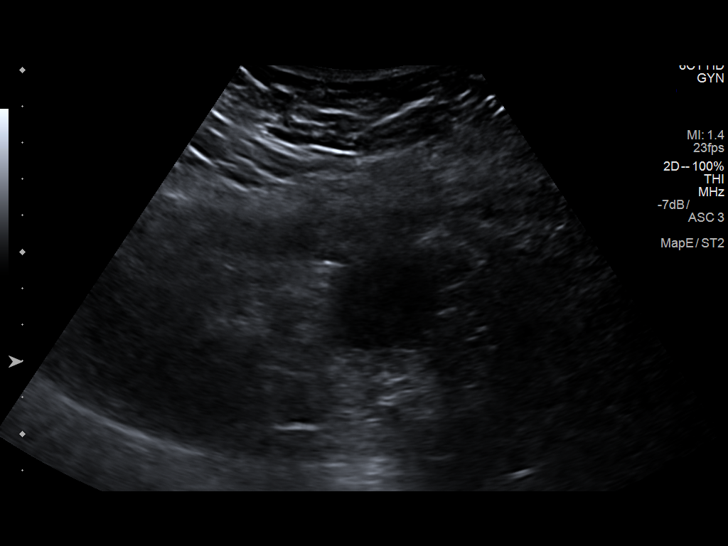
[im 35/94]
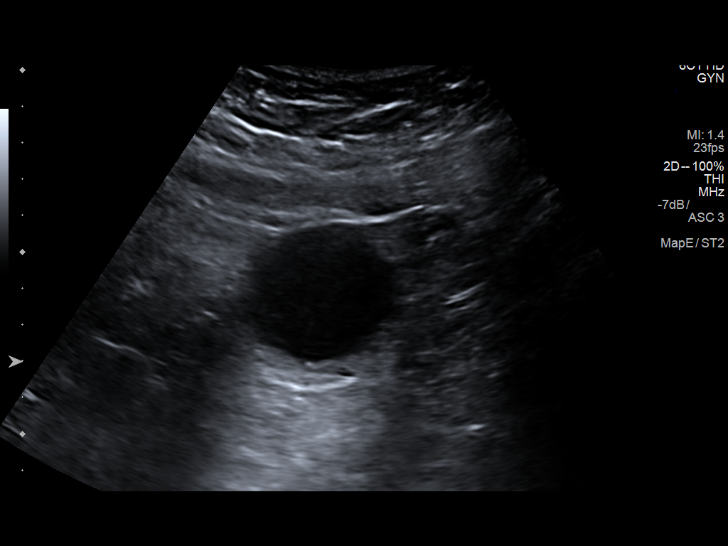
[im 43/94]
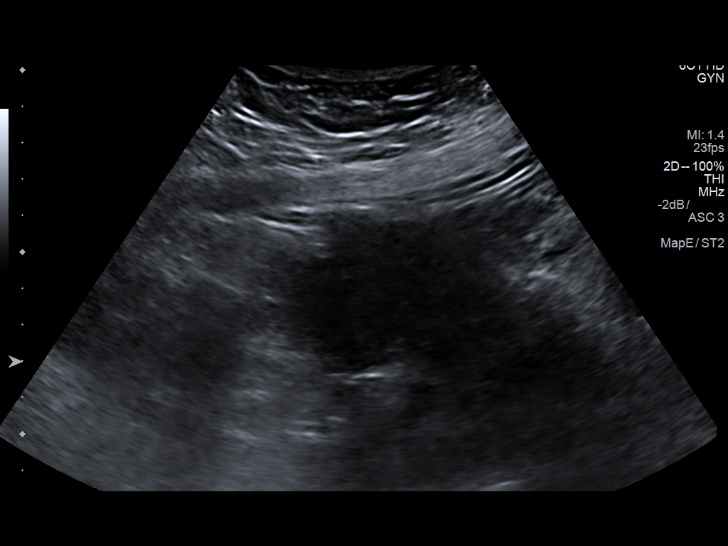
[im 51/94]
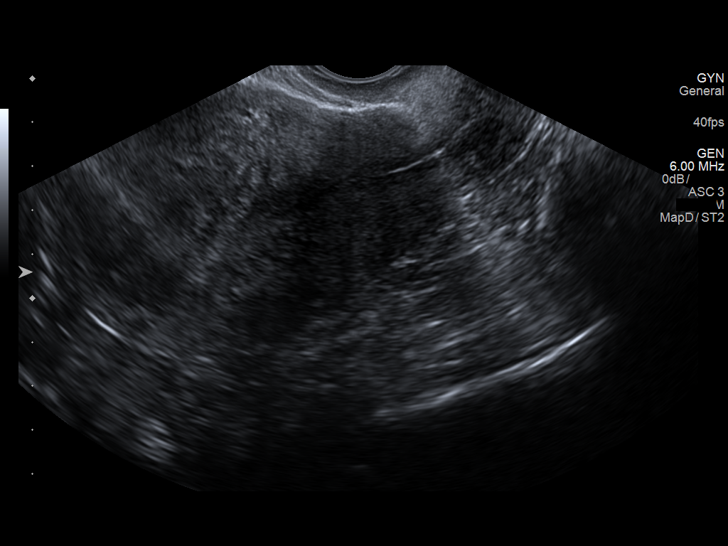
[im 59/94]
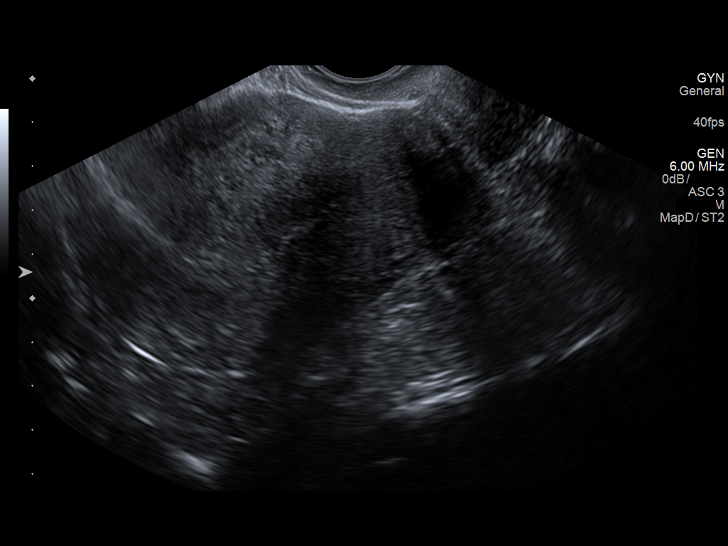
[im 63/94]
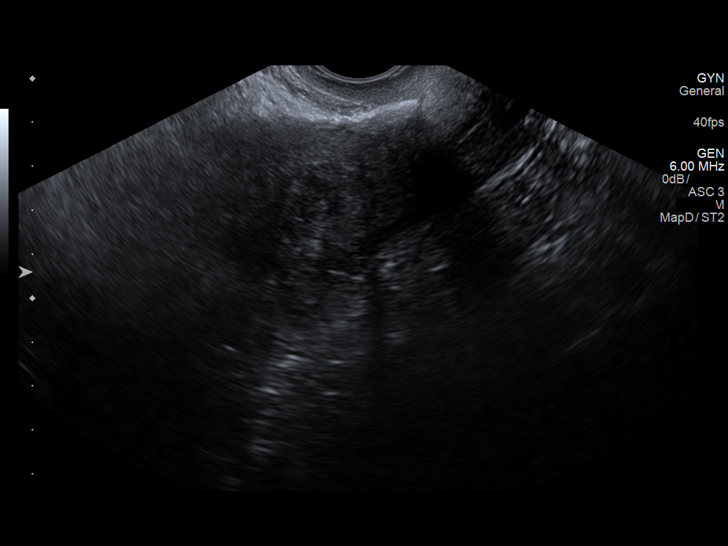
[im 70/94]
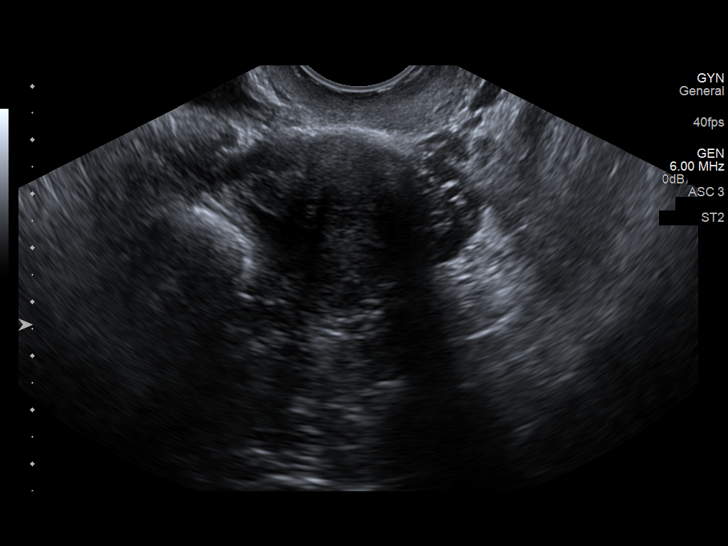
[im 78/94]
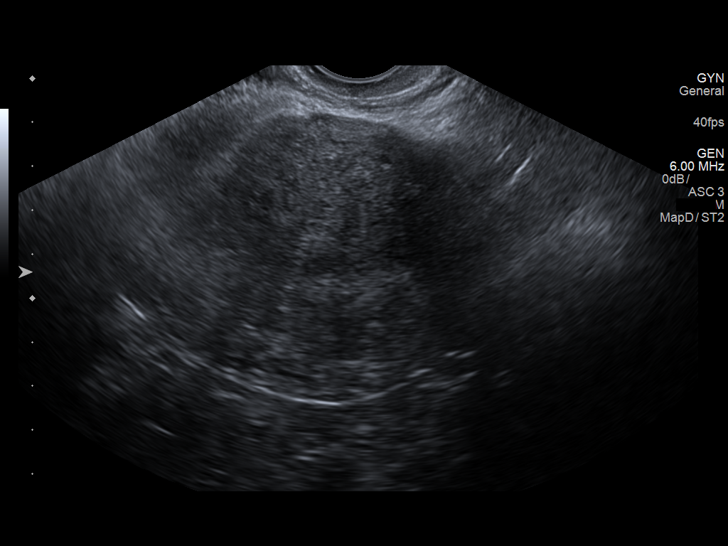
[im 86/94]
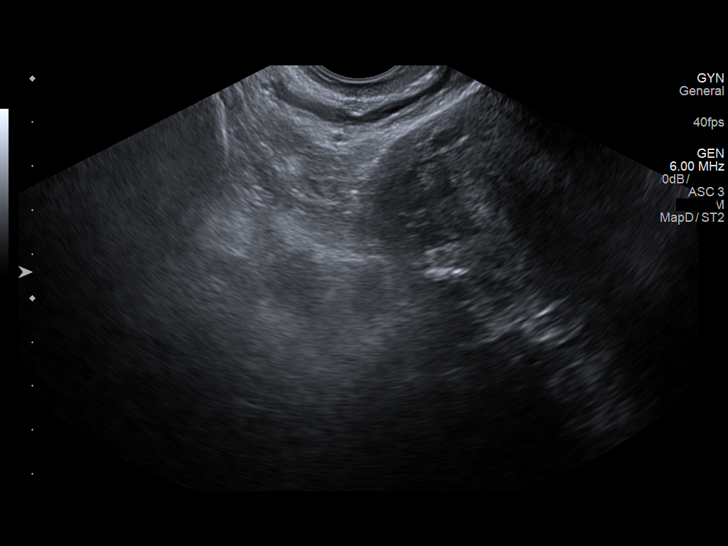
[im 94/94]
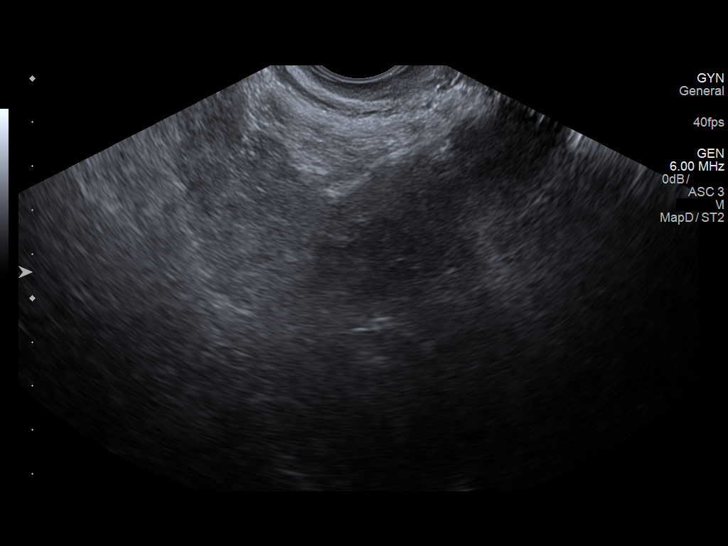

[14 of 25 positions shown; findings below may reference images not displayed]

FINDINGS: Uterus

Measurements: 13.9 x 6.2 x 9.0 cm.. The uterus is heterogeneous with
no focal mass.

Endometrium

Thickness: 5.8 mm.  No focal abnormality visualized.

Right ovary

Not visualized.

Left ovary

Measurements: 6.5 x 4.2 x 5.3 cm. Contains a dominant cyst/follicle
measuring 4.1 x 3.2 x 3.9 cm.

Other findings

No abnormal free fluid.
IMPRESSION: 1. Uterus is mildly enlarged. No focal mass. The endometrium is
normal.
2. There is a dominant cyst/follicle in the left ovary measuring up
to 4.1 cm.

## 2018-11-02 ENCOUNTER — Other Ambulatory Visit (HOSPITAL_COMMUNITY): Payer: Self-pay | Admitting: *Deleted

## 2018-11-02 DIAGNOSIS — Z1231 Encounter for screening mammogram for malignant neoplasm of breast: Secondary | ICD-10-CM

## 2019-01-12 ENCOUNTER — Ambulatory Visit (HOSPITAL_COMMUNITY)
Admission: RE | Admit: 2019-01-12 | Discharge: 2019-01-12 | Disposition: A | Discharge status: Home / Self Care | Payer: Self-pay | Source: Ambulatory Visit | Attending: Obstetrics and Gynecology | Admitting: Obstetrics and Gynecology

## 2019-01-12 ENCOUNTER — Ambulatory Visit
Admission: RE | Admit: 2019-01-12 | Discharge: 2019-01-12 | Disposition: A | Discharge status: Home / Self Care | Payer: No Typology Code available for payment source | Source: Ambulatory Visit | Attending: Obstetrics and Gynecology | Admitting: Obstetrics and Gynecology

## 2019-01-12 ENCOUNTER — Encounter (HOSPITAL_COMMUNITY): Payer: Self-pay

## 2019-01-12 ENCOUNTER — Other Ambulatory Visit: Payer: Self-pay

## 2019-01-12 DIAGNOSIS — Z1231 Encounter for screening mammogram for malignant neoplasm of breast: Secondary | ICD-10-CM

## 2019-01-12 DIAGNOSIS — Z1239 Encounter for other screening for malignant neoplasm of breast: Secondary | ICD-10-CM

## 2019-01-12 NOTE — Patient Instructions (Signed)
Explained breast self awareness with Sarah Foley. Patient did not need a Pap smear today due to last Pap smear and HPV typing was 05/12/2017. Let her know [facility] will cover Pap smears and HPV typing every 5 years unless has a history of abnormal Pap smears. Referred patient to the [facility] for a screening mammogram. Appointment scheduled for Thursday, January 12, 2019 at 1430. Patient aware of appointment and will be there. Let [facility] know [facility] will follow up with her within the next couple weeks with results of mammogram by letter or phone. Sarah Foley verbalized understanding.  Zakk Borgen, Arvil Chaco, RN 3:02 PM

## 2019-01-12 NOTE — Progress Notes (Signed)
No complaints today.   Pap Smear: Pap smear not completed today. Last Pap smear was 05/12/2017 at [facility] and normal with negative HPV. Per patient has no history of an abnormal Pap smear. Last Pap smear result is in Epic.  Physical exam: Breasts Breasts symmetrical. No skin abnormalities bilateral breasts. No nipple retraction bilateral breasts. No nipple discharge bilateral breasts. No lymphadenopathy. No lumps palpated bilateral breasts. No complaints of pain or tenderness on exam. Referred patient to the [facility] for a screening mammogram. Appointment scheduled for Thursday, January 12, 2019 at 1430.        Pelvic/Bimanual No Pap smear completed today since last Pap smear and HPV typing was 05/12/2017. Pap smear not indicated per B[facility]CP guidelines.   Smoking History: Patient has never smoked.  Patient Navigation: Patient education provided. Access to services provided for patient through [facility] program. Spanish interpreter provided.   Breast and Cervical Cancer Risk Assessment: Patient has no family history of breast cancer, known genetic mutations, or radiation treatment to the chest before age 41. Patient has no history of cervical dysplasia, immunocompromised, or DES exposure in-utero.  Risk Assessment    Risk Scores      01/12/2019   Last edited by: Armond Hang, LPN   5-year risk: 0.5 %   Lifetime risk: 5.3 %         Used Spanish interpreter Rudene Anda from [place].

## 2019-01-16 ENCOUNTER — Encounter (HOSPITAL_COMMUNITY): Payer: Self-pay | Admitting: *Deleted

## 2019-04-13 ENCOUNTER — Other Ambulatory Visit: Payer: No Typology Code available for payment source

## 2019-04-14 ENCOUNTER — Ambulatory Visit: Payer: HRSA Program | Attending: Internal Medicine

## 2019-04-14 ENCOUNTER — Other Ambulatory Visit: Payer: Self-pay

## 2019-04-14 DIAGNOSIS — Z20828 Contact with and (suspected) exposure to other viral communicable diseases: Secondary | ICD-10-CM | POA: Insufficient documentation

## 2019-04-14 DIAGNOSIS — Z20822 Contact with and (suspected) exposure to covid-19: Secondary | ICD-10-CM

## 2019-04-15 LAB — NOVEL CORONAVIRUS, NAA: SARS-CoV-2, NAA: NOT DETECTED

## 2019-04-17 ENCOUNTER — Telehealth: Payer: Self-pay

## 2019-04-17 NOTE — Telephone Encounter (Signed)
Patient husband called in and received negative covid test result

## 2019-07-13 ENCOUNTER — Ambulatory Visit: Payer: No Typology Code available for payment source | Attending: Internal Medicine

## 2019-07-13 DIAGNOSIS — Z23 Encounter for immunization: Secondary | ICD-10-CM

## 2019-07-13 NOTE — Progress Notes (Signed)
   Covid-19 Vaccination Clinic  Name:  Sarah Foley    MRN: [id number] DOB: 08-Aug-1970  07/13/2019  [name] was observed post Covid-19 immunization for 15 minutes without incident. She was provided with Vaccine Information Sheet and instruction to access the V-Safe system.   [name] was instructed to call 911 with any severe reactions post vaccine: Marland Kitchen Difficulty breathing  . Swelling of face and throat  . A fast heartbeat  . A bad rash all over body  . Dizziness and weakness   Immunizations Administered    Name Date Dose VIS Date Route   Pfizer COVID-19 Vaccine 07/13/2019 10:11 AM 0.3 mL 04/07/2019 Intramuscular   Manufacturer: [place]   Lot: [id number]   [place]: [id number]

## 2019-08-07 ENCOUNTER — Ambulatory Visit: Payer: No Typology Code available for payment source | Attending: Internal Medicine

## 2019-08-07 DIAGNOSIS — Z23 Encounter for immunization: Secondary | ICD-10-CM

## 2019-08-07 NOTE — Progress Notes (Signed)
   Covid-19 Vaccination Clinic  Name:  Henreitta Haydel    MRN: [id number] DOB: 06-Jul-1970  08/07/2019  [name] was observed post Covid-19 immunization for 15 minutes without incident. She was provided with Vaccine Information Sheet and instruction to access the V-Safe system.   Ms. Filho was instructed to call 911 with any severe reactions post vaccine: Marland Kitchen Difficulty breathing  . Swelling of face and throat  . A fast heartbeat  . A bad rash all over body  . Dizziness and weakness   Immunizations Administered    Name Date Dose VIS Date Route   Pfizer COVID-19 Vaccine 08/07/2019  9:30 AM 0.3 mL 04/07/2019 Intramuscular   Manufacturer: [place]   Lot: [id number]   [place]: [id number]

## 2020-03-26 ENCOUNTER — Other Ambulatory Visit (HOSPITAL_COMMUNITY): Payer: Self-pay | Admitting: *Deleted

## 2020-03-26 DIAGNOSIS — Z1231 Encounter for screening mammogram for malignant neoplasm of breast: Secondary | ICD-10-CM

## 2020-04-10 ENCOUNTER — Other Ambulatory Visit: Payer: Self-pay

## 2020-04-10 ENCOUNTER — Ambulatory Visit (HOSPITAL_COMMUNITY)
Admission: RE | Admit: 2020-04-10 | Discharge: 2020-04-10 | Disposition: A | Discharge status: Home / Self Care | Payer: PRIVATE HEALTH INSURANCE | Source: Ambulatory Visit | Attending: *Deleted | Admitting: *Deleted

## 2020-04-10 DIAGNOSIS — Z1231 Encounter for screening mammogram for malignant neoplasm of breast: Secondary | ICD-10-CM | POA: Diagnosis not present

## 2021-05-22 ENCOUNTER — Other Ambulatory Visit: Payer: Self-pay | Admitting: *Deleted

## 2021-05-22 ENCOUNTER — Other Ambulatory Visit (HOSPITAL_COMMUNITY): Payer: Self-pay | Admitting: *Deleted

## 2021-05-22 DIAGNOSIS — R1032 Left lower quadrant pain: Secondary | ICD-10-CM

## 2021-05-22 DIAGNOSIS — R1031 Right lower quadrant pain: Secondary | ICD-10-CM

## 2021-05-29 ENCOUNTER — Ambulatory Visit (HOSPITAL_COMMUNITY)
Admission: RE | Admit: 2021-05-29 | Discharge: 2021-05-29 | Disposition: A | Discharge status: Home / Self Care | Payer: Self-pay | Source: Ambulatory Visit | Attending: *Deleted | Admitting: *Deleted

## 2021-05-29 ENCOUNTER — Other Ambulatory Visit: Payer: Self-pay

## 2021-05-29 DIAGNOSIS — R1031 Right lower quadrant pain: Secondary | ICD-10-CM | POA: Insufficient documentation

## 2021-05-29 DIAGNOSIS — R1032 Left lower quadrant pain: Secondary | ICD-10-CM | POA: Insufficient documentation

## 2021-07-01 ENCOUNTER — Other Ambulatory Visit (HOSPITAL_COMMUNITY): Payer: Self-pay | Admitting: Obstetrics and Gynecology

## 2021-07-01 DIAGNOSIS — Z1231 Encounter for screening mammogram for malignant neoplasm of breast: Secondary | ICD-10-CM

## 2021-07-25 ENCOUNTER — Inpatient Hospital Stay (HOSPITAL_COMMUNITY): Payer: Self-pay | Attending: Obstetrics and Gynecology | Admitting: *Deleted

## 2021-07-25 ENCOUNTER — Encounter (HOSPITAL_COMMUNITY): Payer: Self-pay

## 2021-07-25 ENCOUNTER — Other Ambulatory Visit: Payer: Self-pay

## 2021-07-25 ENCOUNTER — Ambulatory Visit (HOSPITAL_COMMUNITY)
Admission: RE | Admit: 2021-07-25 | Discharge: 2021-07-25 | Disposition: A | Discharge status: Home / Self Care | Payer: Self-pay | Source: Ambulatory Visit | Attending: Obstetrics and Gynecology | Admitting: Obstetrics and Gynecology

## 2021-07-25 VITALS — BP 120/48 | Wt 215.0 lb

## 2021-07-25 DIAGNOSIS — Z1211 Encounter for screening for malignant neoplasm of colon: Secondary | ICD-10-CM

## 2021-07-25 DIAGNOSIS — Z1231 Encounter for screening mammogram for malignant neoplasm of breast: Secondary | ICD-10-CM | POA: Insufficient documentation

## 2021-07-25 DIAGNOSIS — Z1239 Encounter for other screening for malignant neoplasm of breast: Secondary | ICD-10-CM

## 2021-07-25 NOTE — Progress Notes (Signed)
[name] is a 51 y.o. female who presents to [facility] clinic today with no complaints.  ?  ?Pap Smear: Pap smear not completed today. Last Pap smear was in February 2023 at the [facility] clinic and was normal per patient. Patients previous Pap smear was 05/12/2017 at [facility] and normal with negative HPV. Per patient has no history of an abnormal Pap smear. Last Pap smear result is not available in Epic. ?  ?Physical exam: ?Breasts ?Breasts symmetrical. No skin abnormalities bilateral breasts. No nipple retraction bilateral breasts. No nipple discharge bilateral breasts. No lymphadenopathy. No lumps palpated bilateral breasts.No complaints of pain or tenderness on exam.     ? ?MS DIGITAL SCREENING TOMO BILATERAL ? ?Result Date: 04/14/2020 ?CLINICAL DATA:  Screening. EXAM: DIGITAL SCREENING BILATERAL MAMMOGRAM WITH TOMO AND CAD COMPARISON:  Previous exam(s). ACR Breast Density Category c: The breast tissue is heterogeneously dense, which may obscure small masses. FINDINGS: There are no findings suspicious for malignancy. Images were processed with CAD. IMPRESSION: No mammographic evidence of malignancy. A result letter of this screening mammogram will be mailed directly to the patient. RECOMMENDATION: Screening mammogram in one year. (Code:SM-B-01Y) BI-RADS CATEGORY  1: Negative. Electronically Signed   By: Lajean Manes M.D.   On: 04/14/2020 12:19  ? ?MS DIGITAL SCREENING TOMO BILATERAL ? ?Result Date: 01/13/2019 ?CLINICAL DATA:  Screening. EXAM: DIGITAL SCREENING BILATERAL MAMMOGRAM WITH TOMO AND CAD COMPARISON:  Previous exam(s). ACR Breast Density Category c: The breast tissue is heterogeneously dense, which may obscure small masses. FINDINGS: There are no findings suspicious for malignancy. Images were processed with CAD. IMPRESSION: No mammographic evidence of malignancy. A result letter of this screening mammogram will be  mailed directly to the patient. RECOMMENDATION: Screening mammogram in one year. (Code:SM-B-01Y) BI-RADS CATEGORY  1: Negative. Electronically Signed   By: Margarette Canada M.D.   On: 01/13/2019 11:30  ? ?MS DIGITAL SCREENING TOMO BILATERAL ? ?Result Date: 09/01/2017 ?CLINICAL DATA:  Screening. EXAM: DIGITAL SCREENING BILATERAL MAMMOGRAM WITH TOMO AND CAD COMPARISON:  Previous exam(s). ACR Breast Density Category b: There are scattered areas of fibroglandular density. FINDINGS: There are no findings suspicious for malignancy. Images were processed with CAD. IMPRESSION: No mammographic evidence of malignancy. A result letter of this screening mammogram will be mailed directly to the patient. RECOMMENDATION: Screening mammogram in one year. (Code:SM-B-01Y) BI-RADS CATEGORY  1: Negative. Electronically Signed   By: Abelardo Diesel M.D.   On: 09/01/2017 08:44   ?  ?Pelvic/Bimanual ?Pap is not indicated today per BCCCP guidelines. ?  ?Smoking History: ?Patient is a former smoker that quit 31 years ago. ?  ?Patient Navigation: ?Patient education provided. Access to services provided for patient through [facility] program. Spanish interpreter Delfino Lovett from [facility] provided.  ? ?Colorectal Cancer Screening: ?Per patient has never had colonoscopy completed. FIT Test given to patient to complete. No complaints today.  ?  ?Breast and Cervical Cancer Risk Assessment: ?Patient does not have family history of breast cancer, known genetic mutations, or radiation treatment to the chest before age 42. Patient does not have history of cervical dysplasia, immunocompromised, or DES exposure in-utero. ? ?Risk Assessment   ? ? Risk Scores   ? ?   07/25/2021 04/10/2020  ? Last edited by: Demetrius Revel, LPN Pamelia Hoit, RT-CT  ? 5-year risk: 0.5 % 0.5 %  ? Lifetime risk: 5 % 5.2 %  ? ?  ?  ? ?  ? ? ?  A: ?BCCCP exam without pap smear ?No complaints. ? ?P: ?Referred patient to [place] for a screening mammogram. Appointment  scheduled Friday, July 25, 2021 at 1230. ? ?Loletta Parish, RN ?07/25/2021 12:14 PM   ?

## 2021-07-25 NOTE — Patient Instructions (Signed)
Explained breast self awareness with Shandria Rosaldo-Landero. Patient did not need a Pap smear today due to last Pap smear was in February 2023 per patient. Let her know BCCCP will cover Pap smears every 3 years unless has a history of abnormal Pap smears. Referred patient to [place] for a screening mammogram. Appointment scheduled Friday, July 25, 2021 at 1230. Patient aware of appointment and will be there. Let patient know [facility] will follow up with her within the next couple weeks with results of her mammogram by letter or phone. Lamija Rosaldo-Landero verbalized understanding. ? ?Janeen Watson, Arvil Chaco, RN ?12:15 PM ? ? ? ? ?

## 2022-02-09 ENCOUNTER — Other Ambulatory Visit (HOSPITAL_COMMUNITY): Payer: Self-pay | Admitting: *Deleted

## 2022-02-09 ENCOUNTER — Ambulatory Visit (HOSPITAL_COMMUNITY)
Admission: RE | Admit: 2022-02-09 | Discharge: 2022-02-09 | Disposition: A | Discharge status: Home / Self Care | Payer: Self-pay | Source: Ambulatory Visit | Attending: *Deleted | Admitting: *Deleted

## 2022-02-09 DIAGNOSIS — R200 Anesthesia of skin: Secondary | ICD-10-CM

## 2022-02-09 DIAGNOSIS — M542 Cervicalgia: Secondary | ICD-10-CM | POA: Insufficient documentation

## 2022-06-04 ENCOUNTER — Encounter: Payer: Self-pay | Admitting: Internal Medicine

## 2022-06-22 NOTE — Progress Notes (Deleted)
Referring Provider: Raiford Simmonds., PA-C Primary Care Physician:  Raiford Simmonds., PA-C Primary Gastroenterologist:  Dr. Abbey Chatters  No chief complaint on file.   HPI:   Sarah Foley is a 52 y.o. female with history of IDA, menorrhagia, *** presenting today at the request of Muse, Rochelle D., PA-C for RLQ, RUQ pain, loose stools, bloating.   Today:     Past Medical History:  Diagnosis Date   Anemia    Cataract    Leye   Depression    No pertinent past medical history     Past Surgical History:  Procedure Laterality Date   CATARACT EXTRACTION, BILATERAL Bilateral 2010   T[place]ov ABLATION N/A 05/25/2017   Procedure: DILATATION & CURETTAGE/HYSTEROSCOPY WITH NOVASURE ENDOMETRIAL ABLATION;  Surgeon: [name], MD;  Location: [facility];  Service: Gynecology;  Laterality: N/A;  no specimen   TUBAL LIGATION  06/2010   neg HCG prior to tubal +hcg 1 week after    Current Outpatient Medications  Medication Sig Dispense Refill   acetaminophen (TYLENOL) 500 MG tablet Take 500 mg by mouth daily as needed for moderate pain or headache.     Ascorbic Acid (VITAMIN C PO) Take 1 tablet by mouth 2 (two) times daily.     citalopram (CELEXA) 20 MG tablet 1 po qd.  Tome una tableta por boca diaria (Patient not taking: Reported on 05/13/2017) 30 tablet 0   ferrous sulfate 325 (65 FE) MG tablet Take 650 mg by mouth 2 (two) times daily.     HYDROcodone-acetaminophen (NORCO/VICODIN) 5-325 MG tablet Take 1-2 tablets by mouth every 6 (six) hours as needed for moderate pain. May take with ibuprofen (Patient not taking: Reported on 06/02/2017) 20 tablet 0   ibuprofen (ADVIL,MOTRIN) 600 MG tablet Take 1 tablet (600 mg total) by mouth every 6 (six) hours as needed. (Patient not taking: Reported on 01/12/2019) 30 tablet 1   omeprazole (PRILOSEC OTC) 20 MG tablet Take 20 mg by mouth daily as needed (heartburn).     No current facility-administered  medications for this visit.    Allergies as of 06/24/2022   (No Known Allergies)    Family History  Problem Relation Age of Onset   Hypertension Sister    Mental retardation Daughter        chromosome number 64 is incomplete   Anesthesia problems Neg Hx    Hypotension Neg Hx    Malignant hyperthermia Neg Hx    Pseudochol deficiency Neg Hx     Social History   Socioeconomic History   Marital status: Married    Spouse name: Not on file   Number of children: 5   Years of education: Not on file   Highest education level: 9th grade  Occupational History   Not on file  Tobacco Use   Smoking status: Never   Smokeless tobacco: Never  Vaping Use   Vaping Use: Never used  Substance and Sexual Activity   Alcohol use: No   Drug use: No   Sexual activity: Yes    Birth control/protection: Surgical    Comment: had tubal in 06/2010  Other Topics Concern   Not on file  Social History Narrative   Not on file   Social Determinants of Health   Financial Resource Strain: Not on file  Food Insecurity: No Food Insecurity (07/25/2021)   Hunger Vital Sign    Worried About Running Out of Food in the Last Year: Never true  Ran Out of Food in the Last Year: Never true  Transportation Needs: No Transportation Needs (07/25/2021)   PRAPARE - Hydrologist (Medical): No    Lack of Transportation (Non-Medical): No  Physical Activity: Not on file  Stress: Not on file  Social Connections: Not on file  Intimate Partner Violence: Not on file    Review of Systems: Gen: Denies any fever, chills, fatigue, weight loss, lack of appetite.  CV: Denies chest pain, heart palpitations, peripheral edema, syncope.  Resp: Denies shortness of breath at rest or with exertion. Denies wheezing or cough.  GI: Denies dysphagia or odynophagia. Denies jaundice, hematemesis, fecal incontinence. GU : Denies urinary burning, urinary frequency, urinary hesitancy MS: Denies joint pain,  muscle weakness, cramps, or limitation of movement.  Derm: Denies rash, itching, dry skin Psych: Denies depression, anxiety, memory loss, and confusion Heme: Denies bruising, bleeding, and enlarged lymph nodes.  Physical Exam: There were no vitals taken for this visit. General:   Alert and oriented. Pleasant and cooperative. Well-nourished and well-developed.  Head:  Normocephalic and atraumatic. Eyes:  Without icterus, sclera clear and conjunctiva pink.  Ears:  Normal auditory acuity. Lungs:  Clear to auscultation bilaterally. No wheezes, rales, or rhonchi. No distress.  Heart:  S1, S2 present without murmurs appreciated.  Abdomen:  +BS, soft, non-tender and non-distended. No HSM noted. No guarding or rebound. No masses appreciated.  Rectal:  Deferred  Msk:  Symmetrical without gross deformities. Normal posture. Extremities:  Without edema. Neurologic:  Alert and  oriented x4;  grossly normal neurologically. Skin:  Intact without significant lesions or rashes. Psych: Normal mood and affect.    Assessment:     Plan:  ***   [name], PA-C [facility] 06/24/2022

## 2022-06-24 ENCOUNTER — Encounter: Payer: Self-pay | Admitting: Gastroenterology

## 2022-06-24 ENCOUNTER — Ambulatory Visit: Payer: Self-pay | Admitting: Gastroenterology

## 2022-09-08 ENCOUNTER — Other Ambulatory Visit (HOSPITAL_COMMUNITY): Payer: Self-pay | Admitting: Obstetrics and Gynecology

## 2022-09-08 DIAGNOSIS — N6311 Unspecified lump in the right breast, upper outer quadrant: Secondary | ICD-10-CM

## 2022-10-09 ENCOUNTER — Other Ambulatory Visit: Payer: Self-pay

## 2022-10-09 ENCOUNTER — Inpatient Hospital Stay: Payer: Self-pay | Admitting: Hematology and Oncology

## 2022-10-09 VITALS — BP 113/57 | Wt 220.2 lb

## 2022-10-09 DIAGNOSIS — N6311 Unspecified lump in the right breast, upper outer quadrant: Secondary | ICD-10-CM

## 2022-10-09 DIAGNOSIS — Z1211 Encounter for screening for malignant neoplasm of colon: Secondary | ICD-10-CM

## 2022-10-09 NOTE — Progress Notes (Signed)
[name] is a 52 y.o. female who presents to [facility] clinic today with complaint of right breast mass.    Pap Smear: Pap not smear completed today. Last Pap smear was 06/30/2021 at [facility] clinic and was normal. Per patient has no history of an abnormal Pap smear. Last Pap smear result is available in Epic.   Physical exam: Breasts Breasts symmetrical. No skin abnormalities bilateral breasts. No nipple retraction bilateral breasts. No nipple discharge bilateral breasts. No lymphadenopathy. No lumps palpated left breasts. Right breast with deep nodule noted at 9 o'clock. MS DIGITAL SCREENING TOMO BILATERAL  Result Date: 07/26/2021 CLINICAL DATA:  Screening. EXAM: DIGITAL SCREENING BILATERAL MAMMOGRAM WITH TOMOSYNTHESIS AND CAD TECHNIQUE: Bilateral screening digital craniocaudal and mediolateral oblique mammograms were obtained. Bilateral screening digital breast tomosynthesis was performed. The images were evaluated with computer-aided detection. COMPARISON:  Previous exam(s). ACR Breast Density Category c: The breast tissue is heterogeneously dense, which may obscure small masses. FINDINGS: There are no findings suspicious for malignancy. IMPRESSION: No mammographic evidence of malignancy. A result letter of this screening mammogram will be mailed directly to the patient. RECOMMENDATION: Screening mammogram in one year. (Code:SM-B-01Y) BI-RADS CATEGORY  1: Negative. Electronically Signed   By: Annia Belt M.D.   On: 07/26/2021 07:03   MS DIGITAL SCREENING TOMO BILATERAL  Result Date: 04/14/2020 CLINICAL DATA:  Screening. EXAM: DIGITAL SCREENING BILATERAL MAMMOGRAM WITH TOMO AND CAD COMPARISON:  Previous exam(s). ACR Breast Density Category c: The breast tissue is heterogeneously dense, which may obscure small masses. FINDINGS: There are no findings suspicious for malignancy. Images were processed with CAD. IMPRESSION: No mammographic evidence of malignancy. A result letter of this  screening mammogram will be mailed directly to the patient. RECOMMENDATION: Screening mammogram in one year. (Code:SM-B-01Y) BI-RADS CATEGORY  1: Negative. Electronically Signed   By: Amie Portland M.D.   On: 04/14/2020 12:19   MS DIGITAL SCREENING TOMO BILATERAL  Result Date: 01/13/2019 CLINICAL DATA:  Screening. EXAM: DIGITAL SCREENING BILATERAL MAMMOGRAM WITH TOMO AND CAD COMPARISON:  Previous exam(s). ACR Breast Density Category c: The breast tissue is heterogeneously dense, which may obscure small masses. FINDINGS: There are no findings suspicious for malignancy. Images were processed with CAD. IMPRESSION: No mammographic evidence of malignancy. A result letter of this screening mammogram will be mailed directly to the patient. RECOMMENDATION: Screening mammogram in one year. (Code:SM-B-01Y) BI-RADS CATEGORY  1: Negative. Electronically Signed   By: Harmon Pier M.D.   On: 01/13/2019 11:30          Pelvic/Bimanual Pap is not indicated today    Smoking History: Patient has never smoked and was not referred to quit line.    Patient Navigation: Patient education provided. Access to services provided for patient through [facility] program. [place] Noe interpreter provided. No transportation provided   Colorectal Cancer Screening: Per patient has never had colonoscopy completed No complaints today. FIT test given.    Breast and Cervical Cancer Risk Assessment: Patient does not have family history of breast cancer, known genetic mutations, or radiation treatment to the chest before age 5. Patient does not have history of cervical dysplasia, immunocompromised, or DES exposure in-utero.  Risk Scores as of 10/09/2022     Sarah Foley           5-year 0.8 %   Lifetime 7.03 %   This patient is [place]/Lat[place] but has no documented birth country, so the [place] model used data from [facility] patients to calculate their risk score. Document a birth country in  the Demographics activity for a more accurate score.          Last calculated by Narda Rutherford, LPN on 1/61/0960 at 10:55 AM        A: BCCCP exam without pap smear Complaint of right breast mass noted on exam. Tenderness noted at the site on exam.   P: Referred patient to the [facility] for a diagnostic mammogram. Appointment scheduled 10/13/22.  Ilda Basset A, NP 10/09/2022 11:12 AM

## 2022-10-09 NOTE — Patient Instructions (Signed)
Taught Sarah Foley about self breast awareness and gave educational materials to take home. Patient did not need a Pap smear today due to last Pap smear was in 06/30/21 per patient. Let her know BCCCP will cover Pap smears every 5 years unless has a history of abnormal Pap smears. Referred [facility] to [facility] for diagnostic mammogram. Appointment scheduled for 10/13/22. Patient aware of appointment and will be there. Let patient know will follow up with her within the next couple weeks with results. Sarah Foley verbalized understanding.  Pascal Lux, NP 11:17 AM

## 2022-10-13 ENCOUNTER — Ambulatory Visit (HOSPITAL_COMMUNITY)
Admission: RE | Admit: 2022-10-13 | Discharge: 2022-10-13 | Disposition: A | Discharge status: Home / Self Care | Payer: Self-pay | Source: Ambulatory Visit | Attending: Obstetrics and Gynecology | Admitting: Obstetrics and Gynecology

## 2022-10-13 ENCOUNTER — Encounter (HOSPITAL_COMMUNITY): Payer: Self-pay

## 2022-10-13 ENCOUNTER — Other Ambulatory Visit (HOSPITAL_COMMUNITY): Payer: Self-pay | Admitting: Obstetrics and Gynecology

## 2022-10-13 DIAGNOSIS — R928 Other abnormal and inconclusive findings on diagnostic imaging of breast: Secondary | ICD-10-CM

## 2022-10-13 DIAGNOSIS — N6311 Unspecified lump in the right breast, upper outer quadrant: Secondary | ICD-10-CM | POA: Insufficient documentation

## 2022-10-20 ENCOUNTER — Other Ambulatory Visit (HOSPITAL_COMMUNITY): Payer: Self-pay | Admitting: Obstetrics and Gynecology

## 2022-10-20 DIAGNOSIS — R928 Other abnormal and inconclusive findings on diagnostic imaging of breast: Secondary | ICD-10-CM

## 2022-10-22 ENCOUNTER — Ambulatory Visit (HOSPITAL_COMMUNITY)
Admission: RE | Admit: 2022-10-22 | Discharge: 2022-10-22 | Disposition: A | Discharge status: Home / Self Care | Payer: Self-pay | Source: Ambulatory Visit | Attending: Obstetrics and Gynecology | Admitting: Obstetrics and Gynecology

## 2022-10-22 ENCOUNTER — Encounter (HOSPITAL_COMMUNITY): Payer: Self-pay

## 2022-10-22 VITALS — BP 118/59 | HR 65 | Temp 97.8°F | Resp 16

## 2022-10-22 DIAGNOSIS — R928 Other abnormal and inconclusive findings on diagnostic imaging of breast: Secondary | ICD-10-CM

## 2022-10-22 DIAGNOSIS — N6310 Unspecified lump in the right breast, unspecified quadrant: Secondary | ICD-10-CM | POA: Insufficient documentation

## 2022-10-22 HISTORY — PX: BREAST BIOPSY: SHX20

## 2022-10-22 MED ORDER — LIDOCAINE HCL (PF) 2 % IJ SOLN
10.0000 mL | Freq: Once | INTRAMUSCULAR | Status: AC
Start: 2022-10-22 — End: 2022-10-22
  Administered 2022-10-22: 10 mL via INTRADERMAL

## 2022-10-22 MED ORDER — LIDOCAINE-EPINEPHRINE (PF) 1 %-1:200000 IJ SOLN
INTRAMUSCULAR | Status: AC
  Filled 2022-10-22: qty 30

## 2022-10-22 MED ORDER — LIDOCAINE HCL (PF) 2 % IJ SOLN
INTRAMUSCULAR | Status: AC
  Filled 2022-10-22: qty 10

## 2022-10-22 MED ORDER — LIDOCAINE-EPINEPHRINE (PF) 1 %-1:200000 IJ SOLN
30.0000 mL | Freq: Once | INTRAMUSCULAR | Status: AC
  Administered 2022-10-22: 10 mL via INTRADERMAL

## 2022-10-22 NOTE — Progress Notes (Signed)
Pt brought to [place] [facility] in no obvious distress. Procedure explained, consent obtained. Prepped and draped with sterile technique. Local anesthetic admin without adverse reaction. Access to tissue obtained without difficulty, samples acquired and prepped. Bandage/steristrips applied, no hematoma, no bleeding. DC instruction provided.

## 2022-10-23 LAB — SURGICAL PATHOLOGY

## 2023-01-27 ENCOUNTER — Telehealth: Payer: Self-pay

## 2023-01-27 NOTE — Telephone Encounter (Signed)
Attempted wellness /follow up call to [facility] client [facility].  Interpreter services utilized for call. No answer and unable to leave a message. Will mail welcome letter from [facility] that lists services available and contact information for staff. [facility] appointment at [facility] was 09/2022 and no future appointment showing at this time.   Francee Nodal RN [employer]

## 2023-10-18 IMAGING — US US ABDOMEN COMPLETE
1 series · 13 of 25 positions shown · non-contrast
Comparison: Pelvic ultrasound, most recently 12/15/2016

CLINICAL DATA: Abdominal pain.

EXAM:
ABDOMEN ULTRASOUND COMPLETE

[Series 1: us abdomen complete · 13 of 128 slices shown]
[im 1/128]
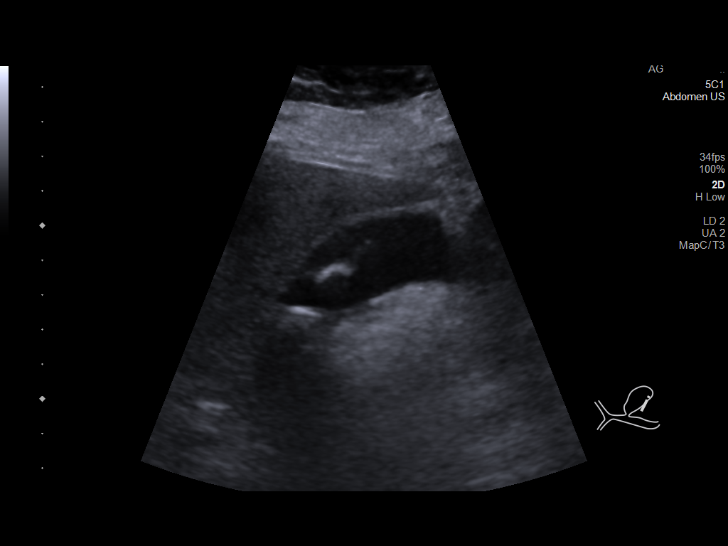
[im 11/128]
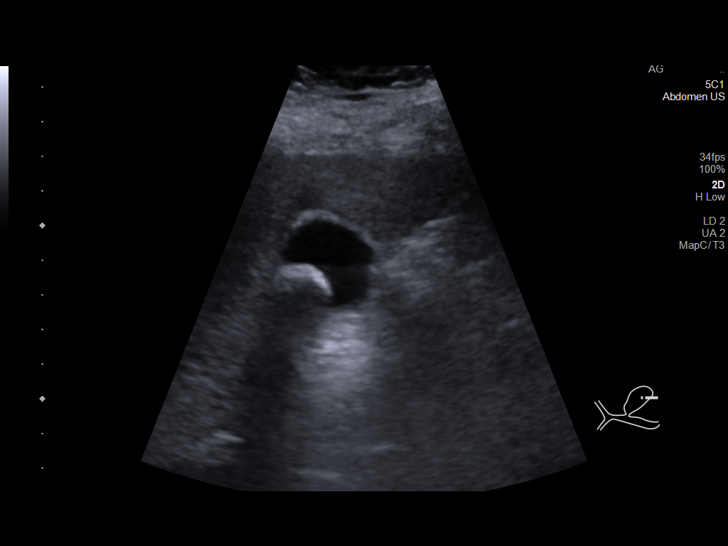
[im 22/128]
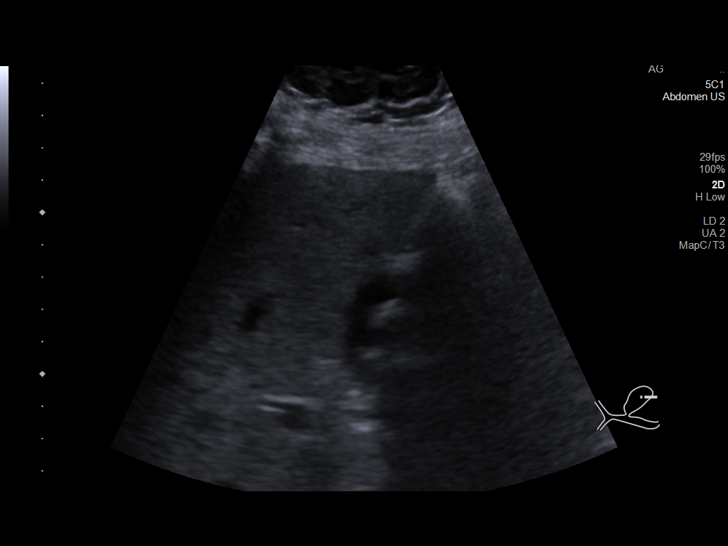
[im 32/128]
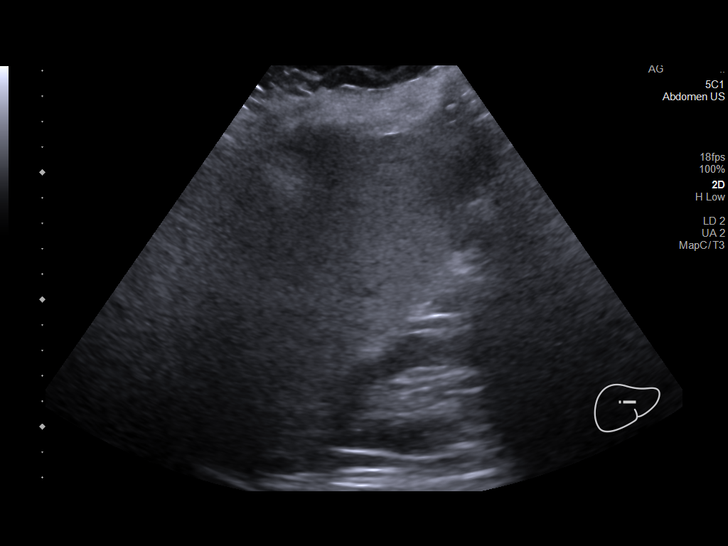
[im 43/128]
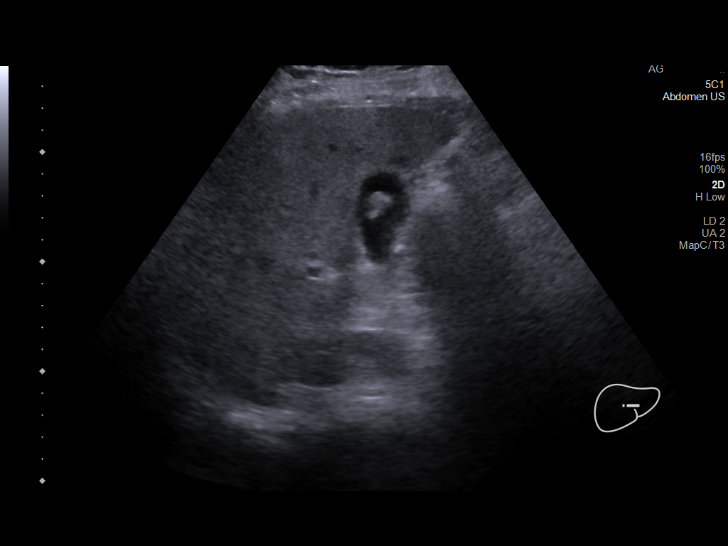
[im 53/128]
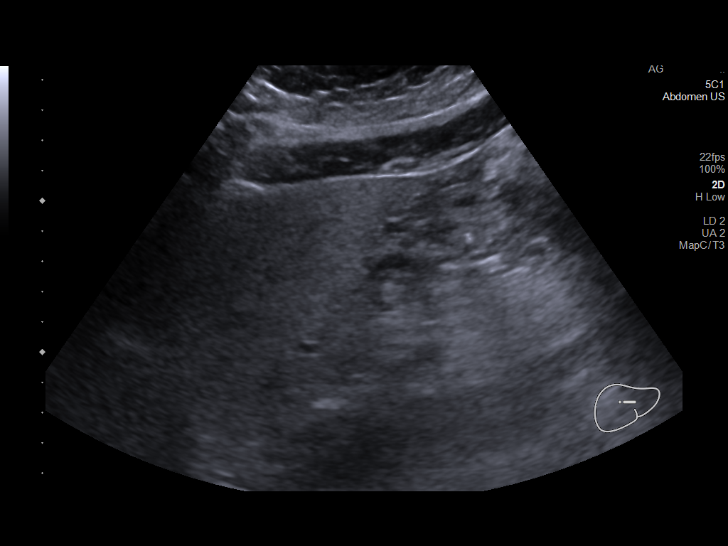
[im 64/128]
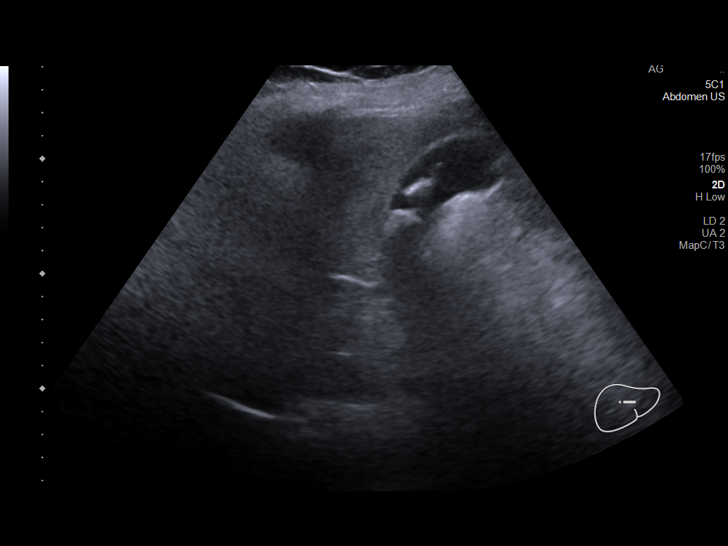
[im 75/128]
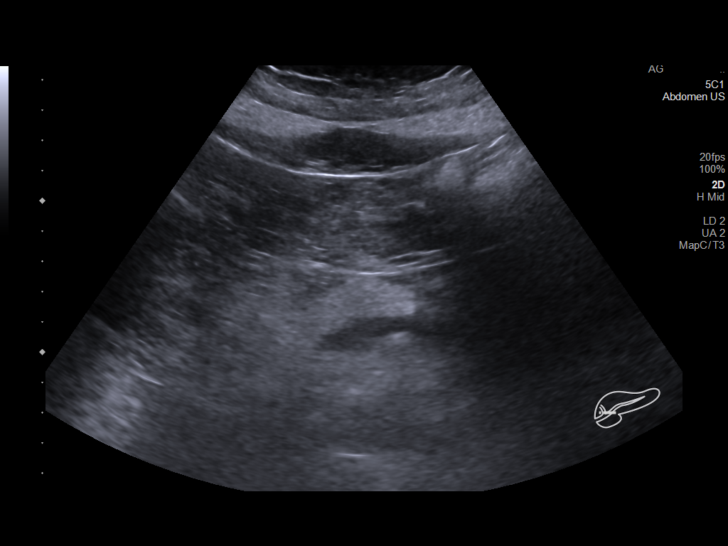
[im 85/128]
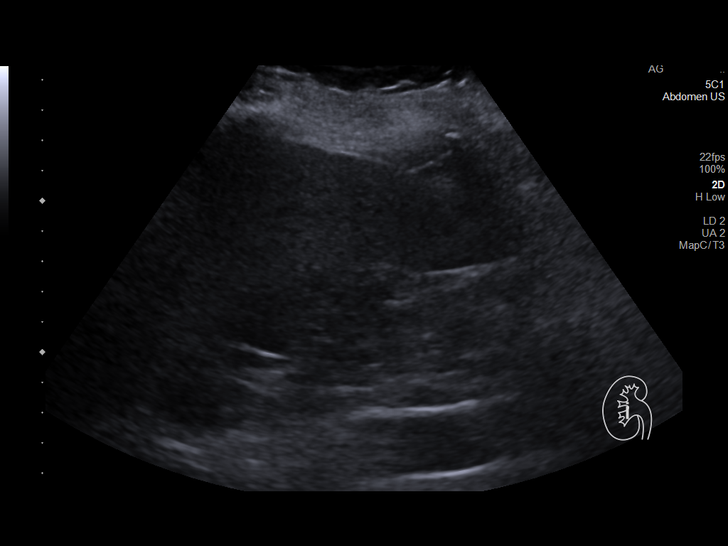
[im 96/128]
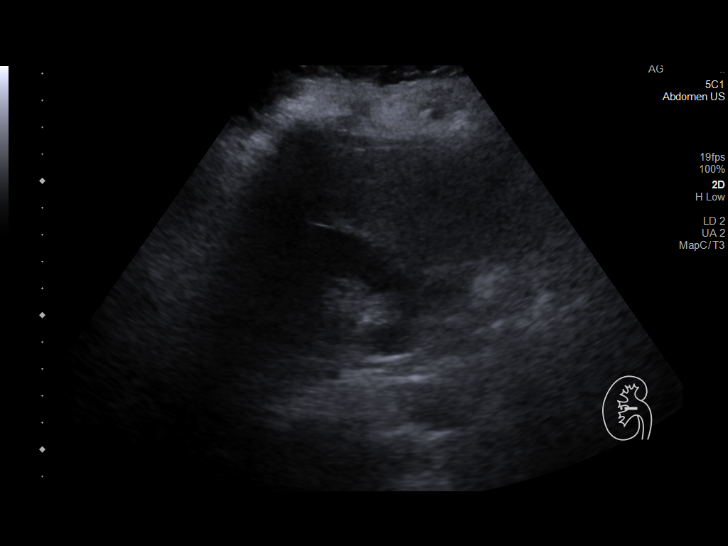
[im 106/128]
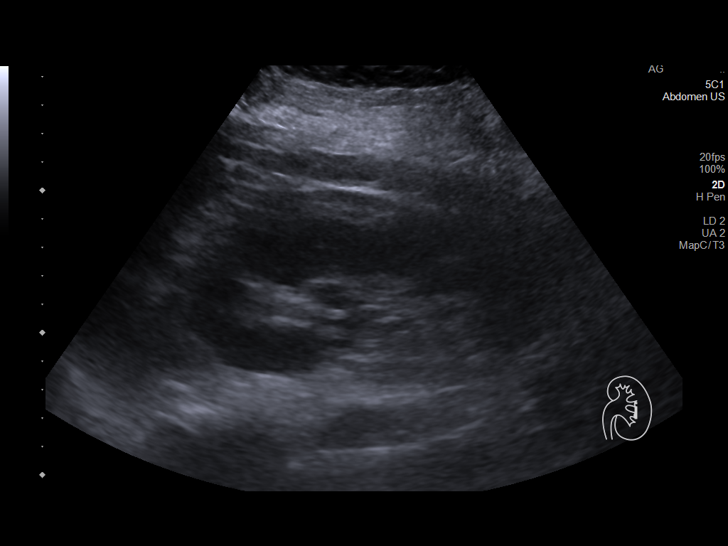
[im 117/128]
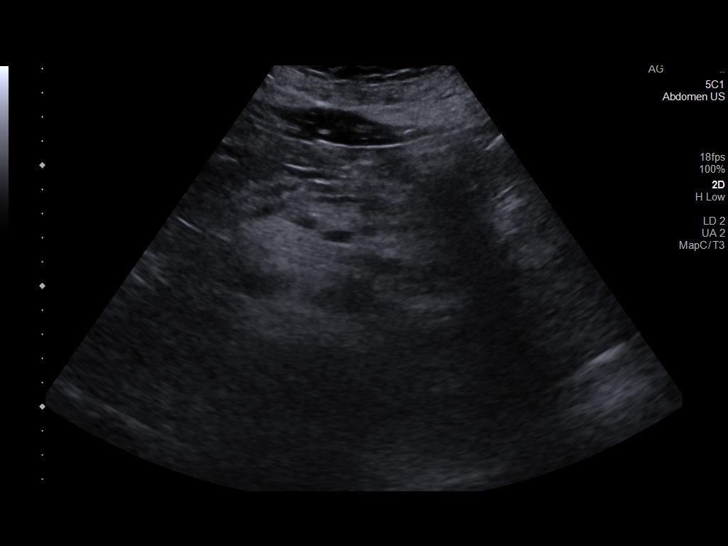
[im 128/128]
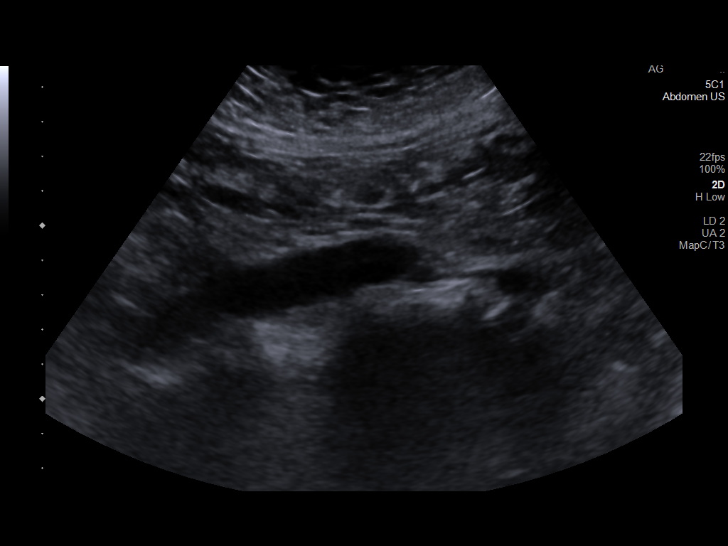

[13 of 25 positions shown; findings below may reference images not displayed]

FINDINGS: Suboptimal evaluation, with poor acoustic penetration secondary to
patient habitus and shadowing from overlying bowel gas.

Gallbladder: 1.3 cm dependent echogenic and shadowing gallstone
within a nondistended gallbladder. No wall thickening visualized. No
sonographic Murphy sign noted by sonographer.

Common bile duct: Diameter: 0.4 cm

Liver: No focal lesion identified. Increased hepatic parenchymal
echogenicity. Portal vein is patent on color Doppler imaging with
normal direction of blood flow towards the liver.

IVC: No abnormality visualized.

Pancreas: Obscured

Spleen: Size and appearance within normal limits.

Right Kidney: Length: 10.5 cm. Echogenicity within normal limits. No
mass or hydronephrosis visualized.

Left Kidney: Length: 12.3 cm. Echogenicity within normal limits. No
mass or hydronephrosis visualized.

Abdominal aorta: Imaged portions are nonaneurysmal.

Other findings: None.
IMPRESSION: Suboptimal evaluation, within these constraints;

1. Echogenic liver. Findings most commonly seen in hepatic
steatosis, though may also represent hepatitis and/or fibrosis.
2. Cholelithiasis. No additional sonographic findings to suggest
acute cholecystitis.

## 2023-11-03 ENCOUNTER — Other Ambulatory Visit (HOSPITAL_COMMUNITY): Payer: Self-pay | Admitting: *Deleted

## 2023-11-03 DIAGNOSIS — Z1231 Encounter for screening mammogram for malignant neoplasm of breast: Secondary | ICD-10-CM

## 2023-11-11 ENCOUNTER — Encounter (HOSPITAL_COMMUNITY): Payer: Self-pay

## 2023-11-11 ENCOUNTER — Ambulatory Visit (HOSPITAL_COMMUNITY)
Admission: RE | Admit: 2023-11-11 | Discharge: 2023-11-11 | Disposition: A | Discharge status: Home / Self Care | Payer: Self-pay | Source: Ambulatory Visit | Attending: *Deleted | Admitting: *Deleted

## 2023-11-11 DIAGNOSIS — Z1231 Encounter for screening mammogram for malignant neoplasm of breast: Secondary | ICD-10-CM | POA: Insufficient documentation

## 2024-01-12 NOTE — Congregational Nurse Program (Signed)
 Completed follow up and review of medical records, labwork, diagnosis and provider notes as it relates to pt's dual enrollment with [employer] and the [facility] uninsured program referral  Reviewed pt's bp values and problem list and NO HTN dx found.   Therefore, pt risk stratification should remain LOW due to patient currently connected to a PCP  [facility])   Plan     -Continous nurse case management will be maintained with patient while pt  is enrolled into [facility] program  -Updated care coordination note related to pts care [facility] enrollment status
